# Patient Record
Sex: Female | Born: 1960 | Race: White | Hispanic: No | Smoking: Current every day smoker
Health system: Southern US, Community
[De-identification: ages and names within clinical notes are randomized; demographics above are authoritative.]

## PROBLEM LIST (undated history)

## (undated) DIAGNOSIS — E119 Type 2 diabetes mellitus without complications: Secondary | ICD-10-CM

## (undated) DIAGNOSIS — E785 Hyperlipidemia, unspecified: Secondary | ICD-10-CM

## (undated) DIAGNOSIS — M1990 Unspecified osteoarthritis, unspecified site: Secondary | ICD-10-CM

## (undated) DIAGNOSIS — C801 Malignant (primary) neoplasm, unspecified: Secondary | ICD-10-CM

## (undated) DIAGNOSIS — I10 Essential (primary) hypertension: Secondary | ICD-10-CM

## (undated) DIAGNOSIS — I639 Cerebral infarction, unspecified: Secondary | ICD-10-CM

## (undated) DIAGNOSIS — J45909 Unspecified asthma, uncomplicated: Secondary | ICD-10-CM

## (undated) DIAGNOSIS — R0600 Dyspnea, unspecified: Secondary | ICD-10-CM

## (undated) DIAGNOSIS — D649 Anemia, unspecified: Secondary | ICD-10-CM

## (undated) DIAGNOSIS — F419 Anxiety disorder, unspecified: Secondary | ICD-10-CM

## (undated) DIAGNOSIS — R51 Headache: Secondary | ICD-10-CM

## (undated) DIAGNOSIS — E039 Hypothyroidism, unspecified: Secondary | ICD-10-CM

## (undated) DIAGNOSIS — F329 Major depressive disorder, single episode, unspecified: Secondary | ICD-10-CM

## (undated) DIAGNOSIS — R519 Headache, unspecified: Secondary | ICD-10-CM

## (undated) DIAGNOSIS — F32A Depression, unspecified: Secondary | ICD-10-CM

## (undated) HISTORY — PX: TUBAL LIGATION: SHX77

## (undated) HISTORY — PX: MASTECTOMY: SHX3

## (undated) HISTORY — PX: CHOLECYSTECTOMY: SHX55

---

## 2012-02-05 DIAGNOSIS — C801 Malignant (primary) neoplasm, unspecified: Secondary | ICD-10-CM

## 2012-02-05 HISTORY — DX: Malignant (primary) neoplasm, unspecified: C80.1

## 2012-02-05 HISTORY — PX: BREAST SURGERY: SHX581

## 2015-07-20 ENCOUNTER — Other Ambulatory Visit (HOSPITAL_COMMUNITY): Payer: Self-pay | Admitting: Interventional Radiology

## 2015-07-20 DIAGNOSIS — I729 Aneurysm of unspecified site: Secondary | ICD-10-CM

## 2015-08-02 ENCOUNTER — Ambulatory Visit (HOSPITAL_COMMUNITY): Payer: Self-pay

## 2015-08-03 ENCOUNTER — Ambulatory Visit (HOSPITAL_COMMUNITY)
Admission: RE | Admit: 2015-08-03 | Discharge: 2015-08-03 | Disposition: A | Discharge status: Home / Self Care | Payer: Self-pay | Source: Ambulatory Visit | Attending: Interventional Radiology | Admitting: Interventional Radiology

## 2015-08-03 DIAGNOSIS — I729 Aneurysm of unspecified site: Secondary | ICD-10-CM

## 2015-08-07 ENCOUNTER — Other Ambulatory Visit (HOSPITAL_COMMUNITY): Payer: Self-pay | Admitting: Interventional Radiology

## 2015-08-07 DIAGNOSIS — I729 Aneurysm of unspecified site: Secondary | ICD-10-CM

## 2015-08-14 ENCOUNTER — Other Ambulatory Visit: Payer: Self-pay | Admitting: Radiology

## 2015-08-15 ENCOUNTER — Other Ambulatory Visit (HOSPITAL_COMMUNITY): Payer: Self-pay | Admitting: Interventional Radiology

## 2015-08-15 ENCOUNTER — Encounter (HOSPITAL_COMMUNITY): Payer: Self-pay

## 2015-08-15 ENCOUNTER — Ambulatory Visit (HOSPITAL_COMMUNITY)
Admission: RE | Admit: 2015-08-15 | Discharge: 2015-08-15 | Disposition: A | Discharge status: Home / Self Care | Payer: Self-pay | Source: Ambulatory Visit | Attending: Interventional Radiology | Admitting: Interventional Radiology

## 2015-08-15 DIAGNOSIS — F17210 Nicotine dependence, cigarettes, uncomplicated: Secondary | ICD-10-CM | POA: Insufficient documentation

## 2015-08-15 DIAGNOSIS — I729 Aneurysm of unspecified site: Secondary | ICD-10-CM

## 2015-08-15 DIAGNOSIS — I6523 Occlusion and stenosis of bilateral carotid arteries: Secondary | ICD-10-CM | POA: Insufficient documentation

## 2015-08-15 DIAGNOSIS — C801 Malignant (primary) neoplasm, unspecified: Secondary | ICD-10-CM | POA: Insufficient documentation

## 2015-08-15 DIAGNOSIS — I671 Cerebral aneurysm, nonruptured: Secondary | ICD-10-CM | POA: Insufficient documentation

## 2015-08-15 DIAGNOSIS — F329 Major depressive disorder, single episode, unspecified: Secondary | ICD-10-CM | POA: Insufficient documentation

## 2015-08-15 DIAGNOSIS — J45909 Unspecified asthma, uncomplicated: Secondary | ICD-10-CM | POA: Insufficient documentation

## 2015-08-15 DIAGNOSIS — R51 Headache: Secondary | ICD-10-CM | POA: Insufficient documentation

## 2015-08-15 DIAGNOSIS — Z853 Personal history of malignant neoplasm of breast: Secondary | ICD-10-CM | POA: Insufficient documentation

## 2015-08-15 DIAGNOSIS — F419 Anxiety disorder, unspecified: Secondary | ICD-10-CM | POA: Insufficient documentation

## 2015-08-15 DIAGNOSIS — Z8673 Personal history of transient ischemic attack (TIA), and cerebral infarction without residual deficits: Secondary | ICD-10-CM | POA: Insufficient documentation

## 2015-08-15 HISTORY — DX: Malignant (primary) neoplasm, unspecified: C80.1

## 2015-08-15 HISTORY — DX: Depression, unspecified: F32.A

## 2015-08-15 HISTORY — DX: Major depressive disorder, single episode, unspecified: F32.9

## 2015-08-15 HISTORY — DX: Headache, unspecified: R51.9

## 2015-08-15 HISTORY — DX: Essential (primary) hypertension: I10

## 2015-08-15 HISTORY — DX: Unspecified asthma, uncomplicated: J45.909

## 2015-08-15 HISTORY — DX: Anxiety disorder, unspecified: F41.9

## 2015-08-15 HISTORY — DX: Headache: R51

## 2015-08-15 HISTORY — DX: Cerebral infarction, unspecified: I63.9

## 2015-08-15 LAB — BASIC METABOLIC PANEL
Anion gap: 8 (ref 5–15)
CHLORIDE: 104 mmol/L (ref 101–111)
CO2: 22 mmol/L (ref 22–32)
Calcium: 9.2 mg/dL (ref 8.9–10.3)
Creatinine, Ser: 0.52 mg/dL (ref 0.44–1.00)
GFR calc Af Amer: >60 mL/min (ref >60)
GFR calc non Af Amer: >60 mL/min (ref >60)
Glucose, Bld: 90 mg/dL (ref 65–99)
POTASSIUM: 3.9 mmol/L (ref 3.5–5.1)
SODIUM: 134 mmol/L — AB (ref 135–145)

## 2015-08-15 LAB — PROTIME-INR
INR: 0.97 (ref 0.00–1.49)
Prothrombin Time: 13 seconds (ref 11.6–15.2)

## 2015-08-15 LAB — CBC
HEMATOCRIT: 40.8 % (ref 36.0–46.0)
Hemoglobin: 13.8 g/dL (ref 12.0–15.0)
MCH: 30.8 pg (ref 26.0–34.0)
MCHC: 33.8 g/dL (ref 30.0–36.0)
MCV: 91.1 fL (ref 78.0–100.0)
Platelets: 272 10*3/uL (ref 150–400)
RBC: 4.48 MIL/uL (ref 3.87–5.11)
RDW: 13.5 % (ref 11.5–15.5)
WBC: 6.5 10*3/uL (ref 4.0–10.5)

## 2015-08-15 LAB — APTT: aPTT: 37 seconds (ref 24–37)

## 2015-08-15 MED ORDER — SODIUM CHLORIDE 0.9 % IV SOLN: INTRAVENOUS | Status: AC

## 2015-08-15 MED ORDER — MIDAZOLAM HCL 2 MG/2ML IJ SOLN
INTRAMUSCULAR | Status: AC
  Filled 2015-08-15: qty 2

## 2015-08-15 MED ORDER — IOPAMIDOL (ISOVUE-300) INJECTION 61%
INTRAVENOUS | Status: AC
  Administered 2015-08-15: 75 mL
  Filled 2015-08-15: qty 150

## 2015-08-15 MED ORDER — IOPAMIDOL (ISOVUE-300) INJECTION 61%
INTRAVENOUS | Status: AC
  Administered 2015-08-15: 21 mL
  Filled 2015-08-15: qty 100

## 2015-08-15 MED ORDER — FENTANYL CITRATE (PF) 100 MCG/2ML IJ SOLN
INTRAMUSCULAR | Status: AC
  Filled 2015-08-15: qty 2

## 2015-08-15 MED ORDER — LIDOCAINE HCL 1 % IJ SOLN
INTRAMUSCULAR | Status: AC
  Filled 2015-08-15: qty 20

## 2015-08-15 MED ORDER — LIDOCAINE HCL 1 % IJ SOLN
INTRAMUSCULAR | Status: AC | PRN
  Administered 2015-08-15: 10 mL

## 2015-08-15 MED ORDER — HEPARIN SODIUM (PORCINE) 1000 UNIT/ML IJ SOLN
INTRAMUSCULAR | Status: AC | PRN
  Administered 2015-08-15: 1000 [IU] via INTRAVENOUS

## 2015-08-15 MED ORDER — SODIUM CHLORIDE 0.9 % IV BOLUS (SEPSIS)
250.0000 mL | Freq: Once | INTRAVENOUS | Status: AC
  Administered 2015-08-15: 250 mL via INTRAVENOUS

## 2015-08-15 MED ORDER — FENTANYL CITRATE (PF) 100 MCG/2ML IJ SOLN
INTRAMUSCULAR | Status: AC | PRN
  Administered 2015-08-15: 25 ug via INTRAVENOUS

## 2015-08-15 MED ORDER — HEPARIN SODIUM (PORCINE) 1000 UNIT/ML IJ SOLN
INTRAMUSCULAR | Status: AC
  Filled 2015-08-15: qty 2

## 2015-08-15 MED ORDER — SODIUM CHLORIDE 0.9 % IV SOLN
Freq: Once | INTRAVENOUS | Status: AC
  Administered 2015-08-15: 10:00:00 via INTRAVENOUS

## 2015-08-15 MED ORDER — MIDAZOLAM HCL 2 MG/2ML IJ SOLN
INTRAMUSCULAR | Status: AC | PRN
  Administered 2015-08-15 (×2): 0.5 mg via INTRAVENOUS

## 2015-08-15 NOTE — Procedures (Signed)
S/P 4 vessel cerebral arteriogram. RT CFA approach. Findings. 1.Approx 70 % stenosis RT ICA prox. 2.approx  16mm wide neck prox RT PCOM  aneurysm

## 2015-08-15 NOTE — Progress Notes (Signed)
V-Pad removed after client up and walked and tol well; right groin no bleeding or hematoma

## 2015-08-15 NOTE — Sedation Documentation (Signed)
Patient is resting comfortably. 

## 2015-08-15 NOTE — Sedation Documentation (Signed)
Patient denies pain and is resting comfortably.  

## 2015-08-15 NOTE — Progress Notes (Signed)
Dr Estanislado Pandy notified of b/p and order noted and saline bolus given iv

## 2015-08-15 NOTE — Discharge Instructions (Signed)

## 2015-08-15 NOTE — H&P (Signed)
Chief Complaint: Patient was seen in consultation today for cerebral arteriogram at the request of Dr Celedonio Miyamoto  Referring Physician(s): Dr Celedonio Miyamoto  Supervising Physician: Luanne Bras  Patient Status: Outpatient  History of Present Illness: Kristen Knox is a 55 y.o. 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 Hypertension   . Stroke ([place])   . Asthma   . Depression   . Anxiety   . Headache   . Cancer ([place]) 2014    Rt breast Ca    Past Surgical History  Procedure Laterality Date  . Breast surgery Right 2014    mastectomy  . Mastectomy    . Cholecystectomy    . Tubal ligation      Allergies: Review of patient's allergies indicates no known allergies.  Medications: Prior to Admission medications   Medication Sig Start Date End Date Taking? Authorizing Provider  ALPRAZolam Duanne Moron) 1 MG tablet Take 1 mg by mouth 3 (three) times daily as needed for anxiety.   Yes Historical Provider, MD  atorvastatin (LIPITOR) 40 MG tablet Take 40 mg by mouth daily.   Yes Historical Provider, MD  lisinopril (PRINIVIL,ZESTRIL) 40 MG tablet Take 40 mg by mouth daily.   Yes Historical Provider, MD  sertraline (ZOLOFT) 100 MG tablet Take 100 mg by mouth daily.   Yes Historical Provider, MD     History reviewed. No pertinent family history.  Social History   Social History  . Marital Status: Married    Spouse Name: N/A  . Number of Children: N/A  . Years of Education: N/A   Social History Main Topics  . Smoking status: Current Every Day Smoker  . Smokeless tobacco: None  . Alcohol Use: Yes  . Drug Use: None  . Sexual  Activity: Not Asked   Other Topics Concern  . None   Social History Narrative  . None     Review of Systems: A 12 point ROS discussed and pertinent positives are indicated in the HPI above.  All other systems are negative.  Review of Systems  Constitutional: Negative for fever, diaphoresis, activity change, appetite change and fatigue.  HENT: Negative for hearing loss, tinnitus, trouble swallowing and voice change.   Eyes: Positive for visual disturbance.  Respiratory: Positive for cough. Negative for shortness of breath.   Cardiovascular: Negative for chest pain.  Gastrointestinal: Negative for abdominal pain.  Neurological: Positive for weakness and headaches. Negative for dizziness, tremors, seizures, syncope, facial asymmetry, speech difficulty, light-headedness and numbness.  Psychiatric/Behavioral: Negative for behavioral problems and confusion.    Vital Signs: BP 103/80 mmHg  Pulse 93  Temp(Src) 98 F (36.7 C) (Oral)  Resp 18  Ht 5\' 2"  (1.575 m)  Wt 117 lb (53.071 kg)  BMI 21.39 kg/m2  SpO2 98%  Physical Exam  Constitutional: She is oriented to person, place, and time. She appears well-nourished.  HENT:  Head: Atraumatic.  Eyes: EOM are normal.  Neck: Neck supple.  Cardiovascular: Normal rate, regular rhythm and normal heart sounds.   No murmur heard. Pulmonary/Chest: Effort normal and breath sounds normal. She has no wheezes.  Abdominal: Soft. Bowel sounds are normal.  There is no tenderness.  Musculoskeletal: Normal range of motion.  Neurological: She is alert and oriented to person, place, and time.  Skin: Skin is warm and dry. No erythema.  Psychiatric: She has a normal mood and affect. Her behavior is normal. Judgment and thought content normal.  Nursing note and vitals reviewed.   Mallampati Score:  MD Evaluation Airway: WNL Heart: WNL Abdomen: WNL Chest/ Lungs: WNL ASA  Classification: 3 Mallampati/Airway Score: One  Imaging: Ir Radiologist  Eval & Mgmt  08/10/2015  EXAM: NEW PATIENT OFFICE VISIT CHIEF COMPLAINT: Worsening headaches.  Left-sided visual field blurring. Current Pain Level: 1-10 HISTORY OF PRESENT ILLNESS: Patient is a 55 year old, right-handed lady who has been referred for evaluation of a left posterior communicating artery region aneurysm. The patient is accompanied by her husband. According to her and her husband, there has been progressively worsening headaches in the occipital regions and also bifrontally for the past few months. These headaches apparently last for about an hour and occur on a daily basis without any precipitating factors. There is no associated visual symptoms, nausea, vomiting passing out, or altered mental status associated with these. Independent of these headaches, the patient also has noticed the appearance of moving black spots in her visual fields at times. This has been on a longer basis and most likely represent floaters. There is no associated nausea, vomiting with these headaches. On close questioning, the patient reportedly also has been having memory issues and being forgetful for the past few years. This mostly involves her recent memory though. Following her recent MRI scan she was told that she had had a stroke which was probably responsible for her visual symptoms. Past Medical History: Significant for asthma, history of breast carcinoma with mastectomy on the right-side. Patient reportedly in remission. High blood pressure, high cholesterol, stroke as mentioned above. Previous Surgical History: Tubal ligation, gallbladder removal and a right-sided mastectomy in 2014 as mentioned above. Medications: Lisinopril. Zoloft for depression. Xanax for anxiety. Lipitor. Allergies: No known allergies reported. Social History: Patient is married has two sons alive and well. Drinks alcohol twice a week, beer. Denies use of illicit chemicals. Smokes a pack per day has done so for the last 45 years. Family  History: Dad with abdominal aortic aneurysm. No history of intracranial aneurysms or strokes. Father died of Alzheimer's. Mother is alive and well. REVIEW OF SYSTEMS: Denies recent chest pain, shortness of breath. Has had intermittent palpitations. Does have chronic smoker's cough with occasional sputum. Denies hemoptyses or wheezing. No abdominal pains or nausea and vomiting as mentioned above. No constipation or diarrhea. Denies any blood in her stools. Denies any dysuria, hematuria or polyuria. Denies any recent chills or fever or rigors. Her weight is steady.  Appetite unchanged. PHYSICAL EXAMINATION: In no acute distress.  Affect normal. Neurologically intact except for apparently left homonymous hemianopsia in the left lower quadrant. ASSESSMENT AND PLAN: The patient's recent MRI and MRA of the brain was reviewed with her and her husband. 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 This measures approximately 4.7 mm. Also noted was a right occipital cortical-based ischemic infarct with encephalomalacia. Additionally nonspecific subcortical white matter hyperintensities are noted on T2 and FLAIR sequences. These were however felt related to chronic microvascular ischemic changes given the patient's history. A demyelinating process or vasculitis is felt to be less likely. Given the patient's symptoms, it was felt the patient would require a diagnostic  catheter arteriogram to evaluate for extracranial and intracranial vasculature. The procedure, the risks, the benefits were all reviewed in detail. Alternatives were reviewed. Further recommendations depending on the findings on the diagnostic catheter arteriogram. The patient and her husband want to proceed with the diagnostic catheter arteriogram will be scheduled at the earliest possible. They were asked to call should they have any concerns or questions. Electronically Signed    By: Luanne Bras M.D.   On: 08/03/2015 15:57    Labs:  CBC:  Recent Labs  08/15/15 0930  WBC 6.5  HGB 13.8  HCT 40.8  PLT 272    COAGS:  Recent Labs  08/15/15 0930  INR 0.97  APTT 37    BMP:  Recent Labs  08/15/15 0930  NA 134*  K 3.9  CL 104  CO2 22  GLUCOSE 90  BUN <5*  CALCIUM 9.2  CREATININE 0.52  GFRNONAA >60  GFRAA >60    LIVER FUNCTION TESTS: No results for input(s): BILITOT, AST, ALT, ALKPHOS, PROT, ALBUMIN in the last 8760 hours.  TUMOR MARKERS: No results for input(s): AFPTM, CEA, CA199, CHROMGRNA in the last 8760 hours.  Assessment and Plan:  Headaches and left visual changes x 2 months Seen by PMD and referred to Dr Estanislado Pandy with findings of CVA and L PCOM aneurysm Now scheduled for cerebral arteriogram Risks and Benefits discussed with the patient including, but not limited to bleeding, infection, vascular injury, contrast induced renal failure, stroke or even death. All of the patient's questions were answered, patient is agreeable to proceed. Consent signed and in chart.  Thank you for this interesting consult.  I greatly enjoyed meeting [facility] and look forward to participating in their care.  A copy of this report was sent to the requesting provider on this date.  Electronically Signed: Danayah Smyre A 08/15/2015, 11:10 AM   I spent a total of  30 Minutes   in face to face in clinical consultation, greater than 50% of which was counseling/coordinating care for cerebral arteriogram

## 2015-08-23 ENCOUNTER — Other Ambulatory Visit (HOSPITAL_COMMUNITY): Payer: Self-pay | Admitting: Interventional Radiology

## 2015-08-23 DIAGNOSIS — I671 Cerebral aneurysm, nonruptured: Secondary | ICD-10-CM

## 2015-08-25 ENCOUNTER — Ambulatory Visit (HOSPITAL_COMMUNITY): Payer: Self-pay

## 2015-09-05 ENCOUNTER — Ambulatory Visit (HOSPITAL_COMMUNITY)
Admission: RE | Admit: 2015-09-05 | Discharge: 2015-09-05 | Disposition: A | Discharge status: Home / Self Care | Payer: Self-pay | Source: Ambulatory Visit | Attending: Interventional Radiology | Admitting: Interventional Radiology

## 2015-09-05 DIAGNOSIS — I671 Cerebral aneurysm, nonruptured: Secondary | ICD-10-CM

## 2015-09-05 HISTORY — PX: IR GENERIC HISTORICAL: IMG1180011

## 2015-09-08 ENCOUNTER — Encounter (HOSPITAL_COMMUNITY): Payer: Self-pay | Admitting: Interventional Radiology

## 2015-11-20 ENCOUNTER — Telehealth (HOSPITAL_COMMUNITY): Payer: Self-pay | Admitting: Radiology

## 2015-11-20 NOTE — Telephone Encounter (Signed)
On Thursday, 11/16/15, pt's husband made multiple calls to our office. He was very agitated and did not sound "normal." Sounded as though he may have been drinking. He was insistent that I do something about his wife. He states that she is having "all the symptoms she could possibly have" to do with her brain condition. He also stated that she is drinking heavily, calling the police on him constantly, stealing everything he has, as well as having blindness at times for an hour or more at a time. I tried to reason with the spouse and encouraged him to call EMS if there were symptoms that needed to be checked. We discussed this at great length. He called the office multiple times that day and Gildardo Pounds and myself spoke to both the husband and the pt. The pt stated the she is fine and it is him who is the problem. She did admit to having 1 episode where she went blind in 1 eye for about an hour. Caryl Pina and I both encouraged the pt and the husband to call 911 for medical assistant or to come directly to the ED. As of today, 11/20/15, I see no record of her making the trip to the ED. I informed the spouse that we could not force his wife to make the trip to the ED and again encouraged him to proceed with calling 911. He said, thanks for nothing, and hung up on me. JM

## 2016-02-05 DIAGNOSIS — I639 Cerebral infarction, unspecified: Secondary | ICD-10-CM

## 2016-02-05 HISTORY — DX: Cerebral infarction, unspecified: I63.9

## 2016-04-24 DIAGNOSIS — F17219 Nicotine dependence, cigarettes, with unspecified nicotine-induced disorders: Secondary | ICD-10-CM | POA: Diagnosis not present

## 2016-04-24 DIAGNOSIS — Z171 Estrogen receptor negative status [ER-]: Secondary | ICD-10-CM | POA: Diagnosis not present

## 2016-04-24 DIAGNOSIS — R634 Abnormal weight loss: Secondary | ICD-10-CM | POA: Diagnosis not present

## 2016-04-24 DIAGNOSIS — R1904 Left lower quadrant abdominal swelling, mass and lump: Secondary | ICD-10-CM | POA: Diagnosis not present

## 2016-04-24 DIAGNOSIS — Z853 Personal history of malignant neoplasm of breast: Secondary | ICD-10-CM | POA: Diagnosis not present

## 2016-06-14 ENCOUNTER — Telehealth (HOSPITAL_COMMUNITY): Payer: Self-pay

## 2016-06-14 NOTE — Telephone Encounter (Signed)
Called to schedule, left vm. AW

## 2016-07-02 ENCOUNTER — Other Ambulatory Visit (HOSPITAL_COMMUNITY): Payer: Self-pay | Admitting: Interventional Radiology

## 2016-07-02 DIAGNOSIS — I771 Stricture of artery: Secondary | ICD-10-CM

## 2016-07-09 ENCOUNTER — Ambulatory Visit (HOSPITAL_COMMUNITY)
Admission: RE | Admit: 2016-07-09 | Discharge: 2016-07-09 | Disposition: A | Discharge status: Home / Self Care | Payer: Medicaid Other | Source: Ambulatory Visit | Attending: Interventional Radiology | Admitting: Interventional Radiology

## 2016-07-09 DIAGNOSIS — I771 Stricture of artery: Secondary | ICD-10-CM

## 2016-07-09 LAB — VAS US CAROTID
LEFT ECA DIAS: -33 cm/s
LEFT VERTEBRAL DIAS: 15 cm/s
LICAPDIAS: -31 cm/s
Left CCA dist dias: -32 cm/s
Left CCA dist sys: -85 cm/s
Left CCA prox dias: 27 cm/s
Left CCA prox sys: 96 cm/s
Left ICA dist dias: -33 cm/s
Left ICA dist sys: -81 cm/s
Left ICA prox sys: -106 cm/s
RCCADSYS: -135 cm/s
RIGHT ECA DIAS: -76 cm/s
RIGHT VERTEBRAL DIAS: 27 cm/s
Right CCA prox dias: 21 cm/s
Right CCA prox sys: 63 cm/s

## 2016-07-09 NOTE — Progress Notes (Signed)
*  PRELIMINARY RESULTS* Vascular Ultrasound Carotid Duplex (Doppler) has been completed.  Preliminary findings: Right 60-79% ICA stenosis, high end of range. Left 1-39% ICA stenosis. Antegrade vertebral flow.   Landry Mellow, RDMS, RVT  07/09/2016, 2:30 PM

## 2016-08-01 ENCOUNTER — Telehealth (HOSPITAL_COMMUNITY): Payer: Self-pay

## 2016-08-01 NOTE — Telephone Encounter (Signed)
Left message for pt to return call. AW 

## 2016-09-03 ENCOUNTER — Telehealth (HOSPITAL_COMMUNITY): Payer: Self-pay

## 2016-09-03 NOTE — Telephone Encounter (Signed)
Called to schedule angio, left message for pt to return call. AW 

## 2016-09-09 ENCOUNTER — Other Ambulatory Visit (HOSPITAL_COMMUNITY): Payer: Self-pay | Admitting: Interventional Radiology

## 2016-09-09 DIAGNOSIS — I771 Stricture of artery: Secondary | ICD-10-CM

## 2016-09-09 DIAGNOSIS — I729 Aneurysm of unspecified site: Secondary | ICD-10-CM

## 2016-09-11 ENCOUNTER — Other Ambulatory Visit: Payer: Self-pay | Admitting: Radiology

## 2016-09-11 ENCOUNTER — Other Ambulatory Visit: Payer: Self-pay | Admitting: General Surgery

## 2016-09-12 ENCOUNTER — Other Ambulatory Visit (HOSPITAL_COMMUNITY): Payer: Self-pay | Admitting: Interventional Radiology

## 2016-09-12 ENCOUNTER — Ambulatory Visit (HOSPITAL_COMMUNITY)
Admission: RE | Admit: 2016-09-12 | Discharge: 2016-09-12 | Disposition: A | Discharge status: Home / Self Care | Payer: Medicaid Other | Source: Ambulatory Visit | Attending: Interventional Radiology | Admitting: Interventional Radiology

## 2016-09-12 DIAGNOSIS — Z8673 Personal history of transient ischemic attack (TIA), and cerebral infarction without residual deficits: Secondary | ICD-10-CM | POA: Insufficient documentation

## 2016-09-12 DIAGNOSIS — R51 Headache: Secondary | ICD-10-CM | POA: Diagnosis present

## 2016-09-12 DIAGNOSIS — J45909 Unspecified asthma, uncomplicated: Secondary | ICD-10-CM | POA: Insufficient documentation

## 2016-09-12 DIAGNOSIS — I771 Stricture of artery: Secondary | ICD-10-CM

## 2016-09-12 DIAGNOSIS — I671 Cerebral aneurysm, nonruptured: Secondary | ICD-10-CM | POA: Insufficient documentation

## 2016-09-12 DIAGNOSIS — F419 Anxiety disorder, unspecified: Secondary | ICD-10-CM | POA: Diagnosis not present

## 2016-09-12 DIAGNOSIS — I729 Aneurysm of unspecified site: Secondary | ICD-10-CM

## 2016-09-12 DIAGNOSIS — Z7982 Long term (current) use of aspirin: Secondary | ICD-10-CM | POA: Insufficient documentation

## 2016-09-12 DIAGNOSIS — Z853 Personal history of malignant neoplasm of breast: Secondary | ICD-10-CM | POA: Diagnosis not present

## 2016-09-12 DIAGNOSIS — I10 Essential (primary) hypertension: Secondary | ICD-10-CM | POA: Diagnosis not present

## 2016-09-12 DIAGNOSIS — F17210 Nicotine dependence, cigarettes, uncomplicated: Secondary | ICD-10-CM | POA: Diagnosis not present

## 2016-09-12 DIAGNOSIS — I6521 Occlusion and stenosis of right carotid artery: Secondary | ICD-10-CM | POA: Insufficient documentation

## 2016-09-12 DIAGNOSIS — F329 Major depressive disorder, single episode, unspecified: Secondary | ICD-10-CM | POA: Diagnosis not present

## 2016-09-12 HISTORY — PX: IR 3D INDEPENDENT WKST: IMG2385

## 2016-09-12 HISTORY — PX: IR ANGIO VERTEBRAL SEL VERTEBRAL UNI R MOD SED: IMG5368

## 2016-09-12 HISTORY — PX: IR ANGIO INTRA EXTRACRAN SEL COM CAROTID INNOMINATE BILAT MOD SED: IMG5360

## 2016-09-12 HISTORY — PX: IR ANGIO VERTEBRAL SEL SUBCLAVIAN INNOMINATE UNI L MOD SED: IMG5364

## 2016-09-12 LAB — BASIC METABOLIC PANEL
ANION GAP: 10 (ref 5–15)
BUN: 5 mg/dL — ABNORMAL LOW (ref 6–20)
CALCIUM: 9 mg/dL (ref 8.9–10.3)
CO2: 21 mmol/L — ABNORMAL LOW (ref 22–32)
Chloride: 102 mmol/L (ref 101–111)
Creatinine, Ser: 0.5 mg/dL (ref 0.44–1.00)
GLUCOSE: 89 mg/dL (ref 65–99)
Potassium: 4 mmol/L (ref 3.5–5.1)
SODIUM: 133 mmol/L — AB (ref 135–145)

## 2016-09-12 LAB — CBC
HEMATOCRIT: 37.5 % (ref 36.0–46.0)
HEMOGLOBIN: 13 g/dL (ref 12.0–15.0)
MCH: 31.3 pg (ref 26.0–34.0)
MCHC: 34.7 g/dL (ref 30.0–36.0)
MCV: 90.1 fL (ref 78.0–100.0)
Platelets: 281 10*3/uL (ref 150–400)
RBC: 4.16 MIL/uL (ref 3.87–5.11)
RDW: 14.1 % (ref 11.5–15.5)
WBC: 5.8 10*3/uL (ref 4.0–10.5)

## 2016-09-12 LAB — APTT: APTT: 36 s (ref 24–36)

## 2016-09-12 LAB — PROTIME-INR
INR: 0.89
PROTHROMBIN TIME: 12 s (ref 11.4–15.2)

## 2016-09-12 MED ORDER — SODIUM CHLORIDE 0.9 % IV SOLN
Freq: Once | INTRAVENOUS | Status: AC
  Administered 2016-09-12: 09:00:00 via INTRAVENOUS

## 2016-09-12 MED ORDER — LIDOCAINE HCL (PF) 1 % IJ SOLN
INTRAMUSCULAR | Status: AC | PRN
  Administered 2016-09-12: 15 mL

## 2016-09-12 MED ORDER — MIDAZOLAM HCL 2 MG/2ML IJ SOLN
INTRAMUSCULAR | Status: AC
Start: 2016-09-12 — End: 2016-09-12
  Filled 2016-09-12: qty 2

## 2016-09-12 MED ORDER — IOPAMIDOL (ISOVUE-300) INJECTION 61%
INTRAVENOUS | Status: AC
  Filled 2016-09-12: qty 50

## 2016-09-12 MED ORDER — IOPAMIDOL (ISOVUE-300) INJECTION 61%
INTRAVENOUS | Status: AC
  Administered 2016-09-12: 21 mL
  Filled 2016-09-12: qty 100

## 2016-09-12 MED ORDER — HEPARIN SODIUM (PORCINE) 1000 UNIT/ML IJ SOLN
INTRAMUSCULAR | Status: AC | PRN
  Administered 2016-09-12: 1500 [IU] via INTRAVENOUS

## 2016-09-12 MED ORDER — HEPARIN SODIUM (PORCINE) 1000 UNIT/ML IJ SOLN
INTRAMUSCULAR | Status: AC
Start: 2016-09-12 — End: 2016-09-12
  Filled 2016-09-12: qty 2

## 2016-09-12 MED ORDER — FENTANYL CITRATE (PF) 100 MCG/2ML IJ SOLN
INTRAMUSCULAR | Status: AC
  Filled 2016-09-12: qty 2

## 2016-09-12 MED ORDER — SODIUM CHLORIDE 0.9 % IV SOLN: INTRAVENOUS | Status: AC

## 2016-09-12 MED ORDER — FENTANYL CITRATE (PF) 100 MCG/2ML IJ SOLN
INTRAMUSCULAR | Status: AC | PRN
  Administered 2016-09-12: 25 ug via INTRAVENOUS

## 2016-09-12 MED ORDER — LIDOCAINE HCL (PF) 1 % IJ SOLN
INTRAMUSCULAR | Status: AC
  Filled 2016-09-12: qty 30

## 2016-09-12 MED ORDER — MIDAZOLAM HCL 2 MG/2ML IJ SOLN
INTRAMUSCULAR | Status: AC | PRN
  Administered 2016-09-12: 1 mg via INTRAVENOUS

## 2016-09-12 MED ORDER — IOPAMIDOL (ISOVUE-300) INJECTION 61%
INTRAVENOUS | Status: AC
Start: 2016-09-12 — End: 2016-09-12
  Administered 2016-09-12: 65 mL
  Filled 2016-09-12: qty 150

## 2016-09-12 NOTE — Sedation Documentation (Signed)
Right groin sheath removed. V-Pad applied 

## 2016-09-12 NOTE — H&P (Signed)
Chief Complaint: Patient was seen in consultation today for right internal carotid artery stenosis.  Supervising Physician: Luanne Bras  Patient Status: [facility] - Out-pt  History of Present Illness: Kristen Knox is a 56 y.o. female with past medical history of anxiety, asthma, right breast cancer, depression, HTN, and CVA. Patient known to [facility] service from prior angiogram in 2017.   Angiogram 08/21/16 showed: Approximately 70% narrowing of the right internal carotid artery at the bulb by the NASCET criteria. Approximately 2 x 2 mm, wide-based, saccular aneurysm arising from the proximal left posterior communicating artery. The angiographic findings were reviewed with the patient and spouse. They will return in consultation to discuss the management of the above findings. In the meantime, the patient has been advised to start taking one baby aspirin a day.  Patient was planning for follow-up for discussion re: management, however patient's husband became ill and she was unable to proceed. For this reason, a repeat diagnostic angiogram is warranted.   Patient presents for procedure today in her usual state of health. She does say she has recently been diagnosed with a "blood protein disorder" but doesn't know which one.  She has been NPO other than a few sips of coffee with medication this AM. She does not take blood thinners and has continued her aspirin.   Past Medical History:  Diagnosis Date  . Anxiety   . Asthma   . Cancer ([place]) 2014   Rt breast Ca  . Depression   . Headache   . Hypertension   . Stroke [facility])     Past Surgical History:  Procedure Laterality Date  . BREAST SURGERY Right 2014   mastectomy  . CHOLECYSTECTOMY    . IR GENERIC HISTORICAL  09/05/2015   IR RADIOLOGIST EVAL & MGMT 09/05/2015 [facility]-INTERV RAD  . MASTECTOMY    . TUBAL LIGATION      Allergies: Patient has no known allergies.  Medications: Prior to Admission medications   Medication Sig Start  Date End Date Taking? Authorizing Provider  ALPRAZolam Duanne Moron) 1 MG tablet Take 1 mg by mouth 3 (three) times daily as needed for anxiety.   Yes [provider]  aspirin 81 MG chewable tablet Chew 81 mg by mouth daily.   Yes [provider]  atorvastatin (LIPITOR) 40 MG tablet Take 40 mg by mouth daily.   Yes [provider]  FLUoxetine (PROZAC) 40 MG capsule Take 40 mg by mouth daily.   Yes [provider]  lisinopril (PRINIVIL,ZESTRIL) 40 MG tablet Take 40 mg by mouth daily.   Yes [provider]     No family history on file.  Social History   Social History  . Marital status: Widowed    Spouse name: N/A  . Number of children: N/A  . Years of education: N/A   Social History Main Topics  . Smoking status: Current Every Day Smoker  . Smokeless tobacco: Not on file  . Alcohol use Yes  . Drug use: Unknown  . Sexual activity: Not on file   Other Topics Concern  . Not on file   Social History Narrative  . No narrative on file    Review of Systems  Constitutional: Negative for fatigue and fever.  Eyes: Negative for visual disturbance.  Respiratory: Negative for cough and shortness of breath.   Cardiovascular: Negative for chest pain.  Gastrointestinal: Negative for abdominal pain.  Neurological: Negative for headaches.  Psychiatric/Behavioral: Negative for behavioral problems and confusion.  Vital Signs: BP 117/84   Pulse 90   Temp 98.4 F (36.9 C) (Oral)   Ht 5\' 2"  (1.575 m)   Wt 105 lb (47.6 kg)   SpO2 99%   BMI 19.20 kg/m   Physical Exam  Constitutional: She is oriented to person, place, and time. She appears well-developed.  Cardiovascular: Normal rate, regular rhythm and normal heart sounds.   Pulmonary/Chest: Effort normal and breath sounds normal. No respiratory distress.  Neurological: She is alert and oriented to person, place, and time.  Skin: Skin is warm and dry.  Psychiatric: She has a normal mood and  affect. Her behavior is normal. Judgment and thought content normal.  Nursing note and vitals reviewed.   Mallampati Score:  MD Evaluation Airway: WNL Heart: WNL Abdomen: WNL Chest/ Lungs: WNL ASA  Classification: 3 Mallampati/Airway Score: Two  Imaging: No results found.  Labs:  CBC:  Recent Labs  09/12/16 0830  WBC 5.8  HGB 13.0  HCT 37.5  PLT 281    COAGS:  Recent Labs  09/12/16 0830  INR 0.89  APTT 36    BMP:  Recent Labs  09/12/16 0830  NA 133*  K 4.0  CL 102  CO2 21*  GLUCOSE 89  BUN 5*  CALCIUM 9.0  CREATININE 0.50  GFRNONAA >60  GFRAA >60    LIVER FUNCTION TESTS: No results for input(s): BILITOT, AST, ALT, ALKPHOS, PROT, ALBUMIN in the last 8760 hours.  TUMOR MARKERS: No results for input(s): AFPTM, CEA, CA199, CHROMGRNA in the last 8760 hours.  Assessment and Plan: Patient with past medical history of right internal artery stenosis presents for ongoing evaluation and management.  Patient presents for diagnostic cerebral angiogram today.  Patient presents today in their usual state of health.  She has been NPO and is not currently on blood thinners.   Risks and benefits discussed with the patient including, but not limited to bleeding, infection, vascular injury or contrast induced renal failure. All of the patient's questions were answered, patient is agreeable to proceed. Consent signed and in chart.  Thank you for this interesting consult.  I greatly enjoyed meeting [facility] and look forward to participating in their care.  A copy of this report was sent to the requesting provider on this date.  Electronically Signed: Docia Barrier, PA 09/12/2016, 10:00 AM   I spent a total of    15 Minutes in face to face in clinical consultation, greater than 50% of which was counseling/coordinating care for right internal carotid artery stenosis.

## 2016-09-12 NOTE — Discharge Instructions (Signed)
Angiogram, Care After °This sheet gives you information about how to care for yourself after your procedure. Your doctor may also give you more specific instructions. If you have problems or questions, contact your doctor. °Follow these instructions at home: °Insertion site care °· Follow instructions from your doctor about how to take care of your long, thin tube (catheter) insertion area. Make sure you: °? Wash your hands with soap and water before you change your bandage (dressing). If you cannot use soap and water, use hand sanitizer. °? Change your bandage as told by your doctor. °? Leave stitches (sutures), skin glue, or skin tape (adhesive) strips in place. They may need to stay in place for 2 weeks or longer. If tape strips get loose and curl up, you may trim the loose edges. Do not remove tape strips completely unless your doctor says it is okay. °· Do not take baths, swim, or use a hot tub until your doctor says it is okay. °· You may shower 24-48 hours after the procedure or as told by your doctor. °? Gently wash the area with plain soap and water. °? Pat the area dry with a clean towel. °? Do not rub the area. This may cause bleeding. °· Do not apply powder or lotion to the area. Keep the area clean and dry. °· Check your insertion area every day for signs of infection. Check for: °? More redness, swelling, or pain. °? Fluid or blood. °? Warmth. °? Pus or a bad smell. °Activity °· Rest as told by your doctor, usually for 1-2 days. °· Do not lift anything that is heavier than 10 lbs. (4.5 kg) or as told by your doctor. °· Do not drive for 24 hours if you were given a medicine to help you relax (sedative). °· Do not drive or use heavy machinery while taking prescription pain medicine. °General instructions °· Go back to your normal activities as told by your doctor, usually in about a week. Ask your doctor what activities are safe for you. °· If the insertion area starts to bleed, lie flat and put pressure  on the area. If the bleeding does not stop, get help right away. This is an emergency. °· Drink enough fluid to keep your pee (urine) clear or pale yellow. °· Take over-the-counter and prescription medicines only as told by your doctor. °· Keep all follow-up visits as told by your doctor. This is important. °Contact a doctor if: °· You have a fever. °· You have chills. °· You have more redness, swelling, or pain around your insertion area. °· You have fluid or blood coming from your insertion area. °· The insertion area feels warm to the touch. °· You have pus or a bad smell coming from your insertion area. °· You have more bruising around the insertion area. °· Blood collects in the tissue around the insertion area (hematoma) that may be painful to the touch. °Get help right away if: °· You have a lot of pain in the insertion area. °· The insertion area swells very fast. °· The insertion area is bleeding, and the bleeding does not stop after holding steady pressure on the area. °· The area near or just beyond the insertion area becomes pale, cool, tingly, or numb. °These symptoms may be an emergency. Do not wait to see if the symptoms will go away. Get medical help right away. Call your local emergency services (911 in the [place] Do not drive yourself to the hospital. °Summary °·   After the procedure, it is common to have bruising and tenderness at the long, thin tube insertion area.  After the procedure, it is important to rest and drink plenty of fluids.  Do not take baths, swim, or use a hot tub until your doctor says it is okay to do so. You may shower 24-48 hours after the procedure or as told by your doctor.  If the insertion area starts to bleed, lie flat and put pressure on the area. If the bleeding does not stop, get help right away. This is an emergency. This information is not intended to replace advice given to you by your health care provider. Make sure you discuss any questions you have with  your health care provider. Document Released: 04/19/2008 Document Revised: 01/16/2016 Document Reviewed: 01/16/2016 Elsevier Interactive Patient Education  2017 Else[employer]. Moderate Conscious Sedation, Adult Sedation is the use of medicines to promote relaxation and relieve discomfort and anxiety. Moderate conscious sedation is a type of sedation. Under moderate conscious sedation, you are less alert than normal, but you are still able to respond to instructions, touch, or both. Moderate conscious sedation is used during short medical and dental procedures. It is milder than deep sedation, which is a type of sedation under which you cannot be easily woken up. It is also milder than general anesthesia, which is the use of medicines to make you unconscious. Moderate conscious sedation allows you to return to your regular activities sooner. Tell a health care provider about:  Any allergies you have.  All medicines you are taking, including vitamins, herbs, eye drops, creams, and over-the-counter medicines.  Use of steroids (by mouth or creams).  Any problems you or family members have had with sedatives and anesthetic medicines.  Any blood disorders you have.  Any surgeries you have had.  Any medical conditions you have, such as sleep apnea.  Whether you are pregnant or may be pregnant.  Any use of cigarettes, alcohol, marijuana, or street drugs. What are the risks? Generally, this is a safe procedure. However, problems may occur, including:  Getting too much medicine (oversedation).  Nausea.  Allergic reaction to medicines.  Trouble breathing. If this happens, a breathing tube may be used to help with breathing. It will be removed when you are awake and breathing on your own.  Heart trouble.  Lung trouble.  What happens before the procedure? Staying hydrated Follow instructions from your health care provider about hydration, which may include:  Up to 2 hours before the  procedure - you may continue to drink clear liquids, such as water, clear fruit juice, black coffee, and plain tea.  Eating and drinking restrictions Follow instructions from your health care provider about eating and drinking, which may include:  8 hours before the procedure - stop eating heavy meals or foods such as meat, fried foods, or fatty foods.  6 hours before the procedure - stop eating light meals or foods, such as toast or cereal.  6 hours before the procedure - stop drinking milk or drinks that contain milk.  2 hours before the procedure - stop drinking clear liquids.  Medicine  Ask your health care provider about:  Changing or stopping your regular medicines. This is especially important if you are taking diabetes medicines or blood thinners.  Taking medicines such as aspirin and ibuprofen. These medicines can thin your blood. Do not take these medicines before your procedure if your health care provider instructs you not to.  Tests and exams  You will have a physical exam.  You may have blood tests done to show: ? How well your kidneys and liver are working. ? How well your blood can clot. General instructions  Plan to have someone take you home from the hospital or clinic.  If you will be going home right after the procedure, plan to have someone with you for 24 hours. What happens during the procedure?  An IV tube will be inserted into one of your veins.  Medicine to help you relax (sedative) will be given through the IV tube.  The medical or dental procedure will be performed. What happens after the procedure?  Your blood pressure, heart rate, breathing rate, and blood oxygen level will be monitored often until the medicines you were given have worn off.  Do not drive for 24 hours. This information is not intended to replace advice given to you by your health care provider. Make sure you discuss any questions you have with your health care  provider. Document Released: 10/16/2000 Document Revised: 06/27/2015 Document Reviewed: 05/13/2015 Elsevier Interactive Patient Education  [employer].

## 2016-09-12 NOTE — Procedures (Signed)
S/P 4 vessel cerebral arteriogram RT CFA approach. Findings. 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

## 2016-09-12 NOTE — Sedation Documentation (Signed)
Called to give report. Nurse unavailable. Will call back 

## 2016-09-12 NOTE — Sedation Documentation (Signed)
ETC02 removed per Dr. Deveshwar  

## 2016-09-13 ENCOUNTER — Encounter (HOSPITAL_COMMUNITY): Payer: Self-pay | Admitting: Interventional Radiology

## 2016-09-16 ENCOUNTER — Telehealth (HOSPITAL_COMMUNITY): Payer: Self-pay

## 2016-09-16 ENCOUNTER — Other Ambulatory Visit (HOSPITAL_COMMUNITY): Payer: Self-pay | Admitting: Interventional Radiology

## 2016-09-16 DIAGNOSIS — I729 Aneurysm of unspecified site: Secondary | ICD-10-CM

## 2016-09-16 NOTE — Telephone Encounter (Signed)
Called to schedule 2 wk f/u, left message for pt to return call. AW 

## 2016-09-30 ENCOUNTER — Ambulatory Visit (HOSPITAL_COMMUNITY): Payer: Medicaid Other

## 2016-10-11 ENCOUNTER — Telehealth (HOSPITAL_COMMUNITY): Payer: Self-pay

## 2016-10-11 NOTE — Telephone Encounter (Signed)
Called to schedule f/u, left message for pt to return call. AW 

## 2016-10-23 ENCOUNTER — Ambulatory Visit (HOSPITAL_COMMUNITY)
Admission: RE | Admit: 2016-10-23 | Discharge: 2016-10-23 | Disposition: A | Discharge status: Home / Self Care | Payer: Medicaid Other | Source: Ambulatory Visit | Attending: Interventional Radiology | Admitting: Interventional Radiology

## 2016-10-23 DIAGNOSIS — I729 Aneurysm of unspecified site: Secondary | ICD-10-CM

## 2016-10-23 HISTORY — PX: IR RADIOLOGIST EVAL & MGMT: IMG5224

## 2016-10-24 ENCOUNTER — Encounter (HOSPITAL_COMMUNITY): Payer: Self-pay | Admitting: Interventional Radiology

## 2017-03-18 ENCOUNTER — Other Ambulatory Visit (HOSPITAL_COMMUNITY): Payer: Self-pay | Admitting: Interventional Radiology

## 2017-03-18 DIAGNOSIS — I771 Stricture of artery: Secondary | ICD-10-CM

## 2017-03-18 DIAGNOSIS — I729 Aneurysm of unspecified site: Secondary | ICD-10-CM

## 2017-03-27 ENCOUNTER — Ambulatory Visit (HOSPITAL_COMMUNITY): Payer: Medicaid Other

## 2017-04-02 ENCOUNTER — Ambulatory Visit (HOSPITAL_COMMUNITY)
Admission: RE | Admit: 2017-04-02 | Discharge: 2017-04-02 | Disposition: A | Discharge status: Home / Self Care | Payer: Medicaid Other | Source: Ambulatory Visit | Attending: Interventional Radiology | Admitting: Interventional Radiology

## 2017-04-02 DIAGNOSIS — I6523 Occlusion and stenosis of bilateral carotid arteries: Secondary | ICD-10-CM | POA: Insufficient documentation

## 2017-04-02 DIAGNOSIS — I771 Stricture of artery: Secondary | ICD-10-CM

## 2017-04-02 DIAGNOSIS — I729 Aneurysm of unspecified site: Secondary | ICD-10-CM | POA: Diagnosis not present

## 2017-04-03 NOTE — Progress Notes (Signed)
Bilateral carotid duplex completed. Right -60% to 79% ICA stenosis. Right - 1% to 39% ICA stenosis. Bilateral vertebral artery flow is antegrade. [facility], [place] 04/02/2017

## 2017-04-24 ENCOUNTER — Telehealth (HOSPITAL_COMMUNITY): Payer: Self-pay

## 2017-04-24 NOTE — Telephone Encounter (Signed)
Pt agreed to f/u in 6 months with us carotid. AW 

## 2017-09-18 ENCOUNTER — Other Ambulatory Visit (HOSPITAL_COMMUNITY): Payer: Self-pay | Admitting: Interventional Radiology

## 2017-09-18 ENCOUNTER — Telehealth (HOSPITAL_COMMUNITY): Payer: Self-pay

## 2017-09-18 DIAGNOSIS — I771 Stricture of artery: Secondary | ICD-10-CM

## 2017-09-18 DIAGNOSIS — I729 Aneurysm of unspecified site: Secondary | ICD-10-CM

## 2017-09-18 NOTE — Telephone Encounter (Signed)
Called to schedule f/u US carotid and mra, no answer, left vm. AW

## 2017-09-22 DIAGNOSIS — Z9011 Acquired absence of right breast and nipple: Secondary | ICD-10-CM | POA: Diagnosis not present

## 2017-09-22 DIAGNOSIS — Z853 Personal history of malignant neoplasm of breast: Secondary | ICD-10-CM

## 2017-09-23 ENCOUNTER — Ambulatory Visit (HOSPITAL_COMMUNITY): Admission: RE | Admit: 2017-09-23 | Payer: Medicaid Other | Source: Ambulatory Visit

## 2017-10-03 ENCOUNTER — Ambulatory Visit (HOSPITAL_COMMUNITY)
Admission: RE | Admit: 2017-10-03 | Discharge: 2017-10-03 | Disposition: A | Discharge status: Home / Self Care | Payer: Medicaid Other | Source: Ambulatory Visit | Attending: Interventional Radiology | Admitting: Interventional Radiology

## 2017-10-03 DIAGNOSIS — I671 Cerebral aneurysm, nonruptured: Secondary | ICD-10-CM | POA: Insufficient documentation

## 2017-10-03 DIAGNOSIS — I729 Aneurysm of unspecified site: Secondary | ICD-10-CM

## 2017-10-03 DIAGNOSIS — I771 Stricture of artery: Secondary | ICD-10-CM | POA: Insufficient documentation

## 2017-10-03 NOTE — Progress Notes (Signed)
Carotid artery duplex has been completed. 60-79% ICA stenosis on the right. 40-59% ICA stenosis on the left.  10/03/17 11:43 AM Kristen Knox RVT

## 2017-10-07 ENCOUNTER — Telehealth (HOSPITAL_COMMUNITY): Payer: Self-pay

## 2017-10-07 NOTE — Telephone Encounter (Signed)
Pt agreed to f/u in 6 months with US carotid and mra w/o. AW

## 2018-02-04 DIAGNOSIS — E041 Nontoxic single thyroid nodule: Secondary | ICD-10-CM

## 2018-02-04 HISTORY — DX: Nontoxic single thyroid nodule: E04.1

## 2018-03-19 ENCOUNTER — Telehealth (HOSPITAL_COMMUNITY): Payer: Self-pay

## 2018-03-19 NOTE — Telephone Encounter (Signed)
Called to schedule f/u us carotid, no answer, left vm. AW 

## 2018-04-07 ENCOUNTER — Other Ambulatory Visit (HOSPITAL_COMMUNITY): Payer: Self-pay | Admitting: Interventional Radiology

## 2018-04-07 DIAGNOSIS — I729 Aneurysm of unspecified site: Secondary | ICD-10-CM

## 2018-04-07 DIAGNOSIS — I771 Stricture of artery: Secondary | ICD-10-CM

## 2018-04-20 ENCOUNTER — Telehealth: Payer: Self-pay | Admitting: Physician Assistant

## 2018-04-20 ENCOUNTER — Ambulatory Visit (HOSPITAL_COMMUNITY): Admission: RE | Admit: 2018-04-20 | Payer: Medicaid Other | Source: Ambulatory Visit

## 2018-04-20 NOTE — Telephone Encounter (Signed)
In person follow up cancelled today due to recent COVID-19 outbreak in attempt to limit face to face contact. Patient was called by Dr. Estanislado Pandy at 13:30 today to discuss CTA neck from 04/03/18.  Patient reports that she has had confusion at times - such as going into a room and forgetting why she was there and she also reports intermittent right foot numbness. She denies headaches, weakness, aphasia, dysphagia, facial droop, constant numbness or tingling in her extremities, claudication, difficulty walking, dyspnea, chest pain or vision changes. She continues to smoke approximately 1 pack of cigarettes per day. She has been taking ASA 81 mg QD.   Discussed results of CTA neck over the phone today - Dr. Estanislado Pandy informed patient that the previously known left PCOM aneurysm appears stable and that the right ICA stenosis also appears unchanged at 60-69%. Also noted very mild right arterial dilatation.  Discussed with patient plans for follow up to include: 1. CTA head/neck in 6 months to evaluate left PCOM aneurysm and right ICA stenosis.  2. Continue ASA 81 mg QD 3. Encouraged smoking cessation 4. Maintain DM, BP, HLD control 5. Call IR for worsening symptoms or present to ED for warning symptoms (facial droop, confusion, difficulty walking, weakness, numbness, etc.)  Candiss Norse, PA-C

## 2018-11-30 ENCOUNTER — Other Ambulatory Visit (HOSPITAL_COMMUNITY): Payer: Self-pay | Admitting: Interventional Radiology

## 2018-11-30 DIAGNOSIS — I771 Stricture of artery: Secondary | ICD-10-CM

## 2018-12-10 ENCOUNTER — Ambulatory Visit (HOSPITAL_COMMUNITY): Payer: Medicare HMO | Attending: Interventional Radiology

## 2018-12-10 ENCOUNTER — Encounter (HOSPITAL_COMMUNITY): Payer: Self-pay

## 2019-01-12 ENCOUNTER — Telehealth (HOSPITAL_COMMUNITY): Payer: Self-pay

## 2019-01-12 NOTE — Telephone Encounter (Signed)
Called to reschedule cta, no answer, vm full. AW

## 2019-02-08 ENCOUNTER — Telehealth (HOSPITAL_COMMUNITY): Payer: Self-pay

## 2019-02-08 NOTE — Telephone Encounter (Signed)
Called to reschedule cta, no answer, left vm. AW °

## 2019-03-03 ENCOUNTER — Encounter (HOSPITAL_COMMUNITY): Payer: Self-pay

## 2019-03-03 ENCOUNTER — Ambulatory Visit (HOSPITAL_COMMUNITY): Admission: RE | Admit: 2019-03-03 | Payer: Medicare HMO | Source: Ambulatory Visit

## 2019-04-21 IMAGING — MR MR MRA HEAD W/O CM
1 series · 19 of 48 positions shown · non-contrast
Comparison: Catheter angiogram 09/12/2016.  Head MRA 06/22/2015.

CLINICAL DATA: Follow-up cerebral aneurysm.

EXAM:
MRA HEAD WITHOUT CONTRAST
TECHNIQUE: Angiographic images of the Circle of Willis were obtained using MRA
technique without intravenous contrast.

[Series 5: 3d cow · axial · 0.5mm · 0.41mm/px · z∈[-87,-13]mm · 19 of 172 slices shown]
[im 1/172]
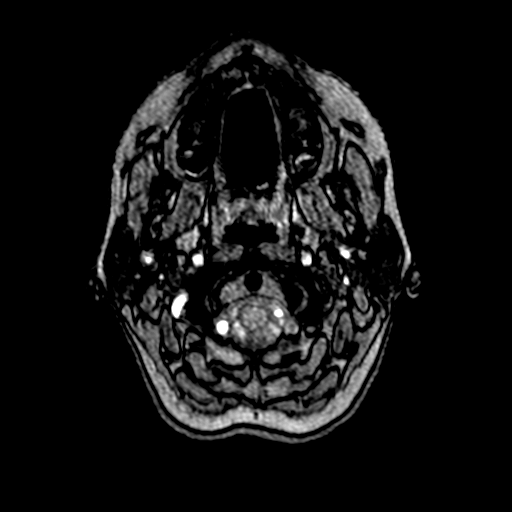
[im 4/172]
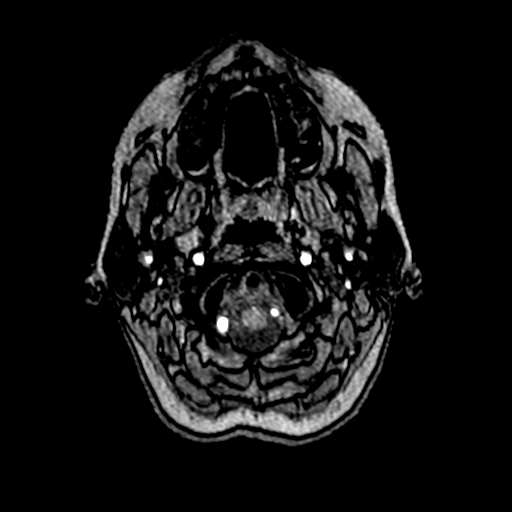
[im 8/172]
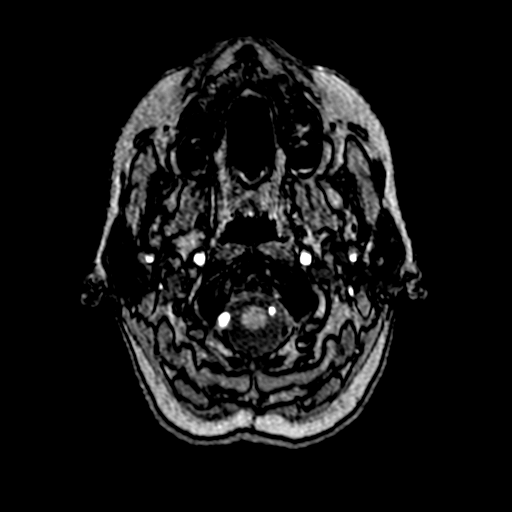
[im 11/172]
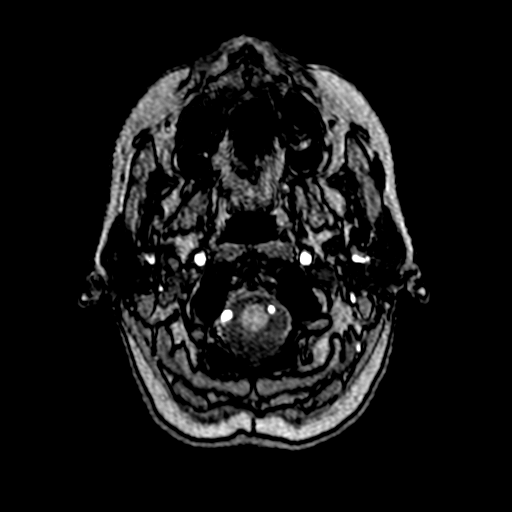
[im 15/172]
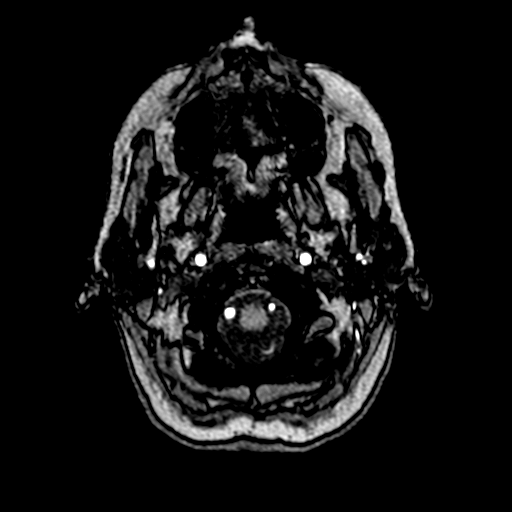
[im 19/172]
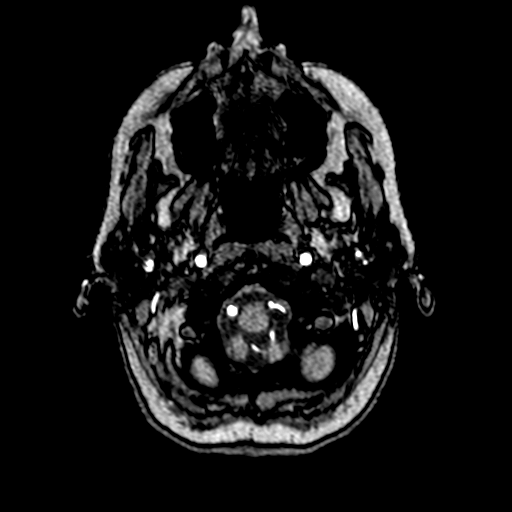
[im 22/172]
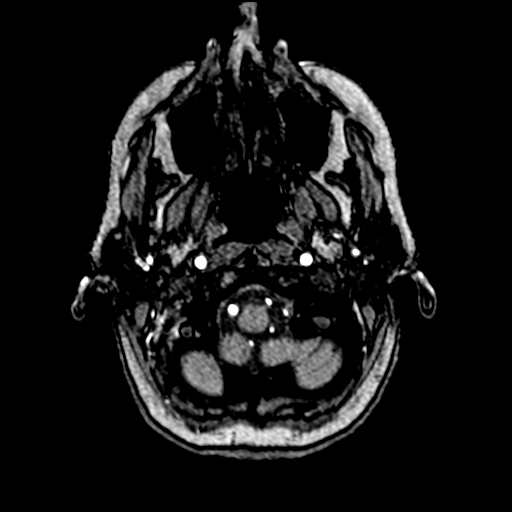
[im 26/172]
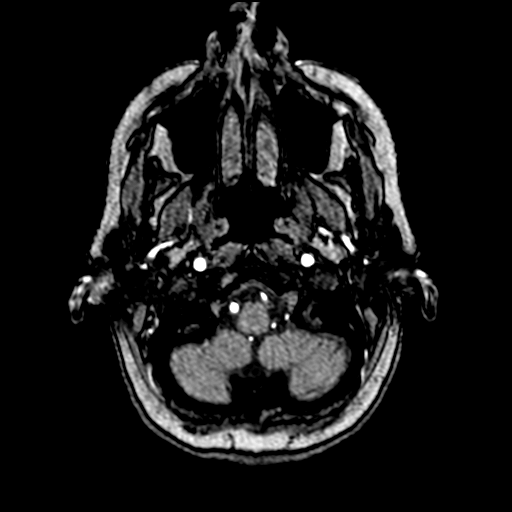
[im 30/172]
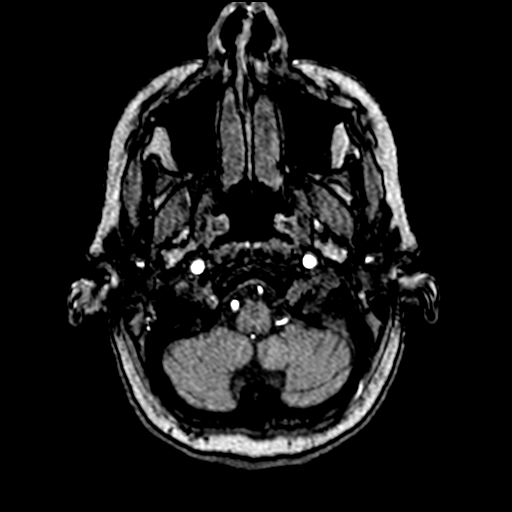
[im 33/172]
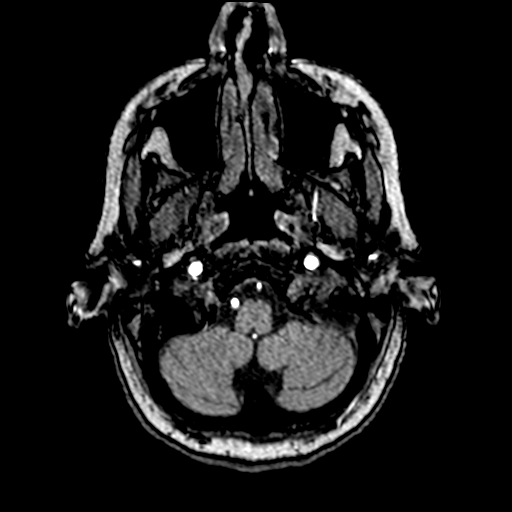
[im 37/172]
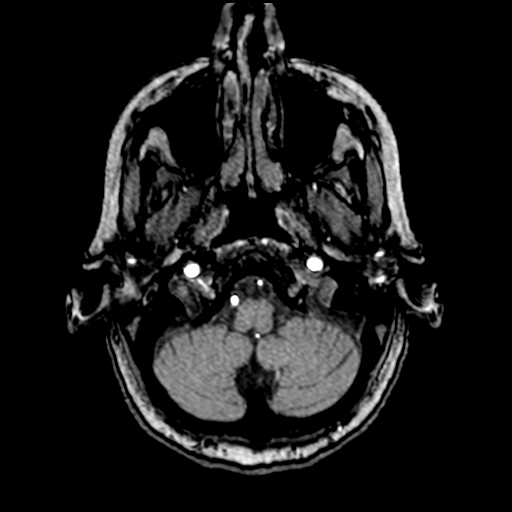
[im 55/172]
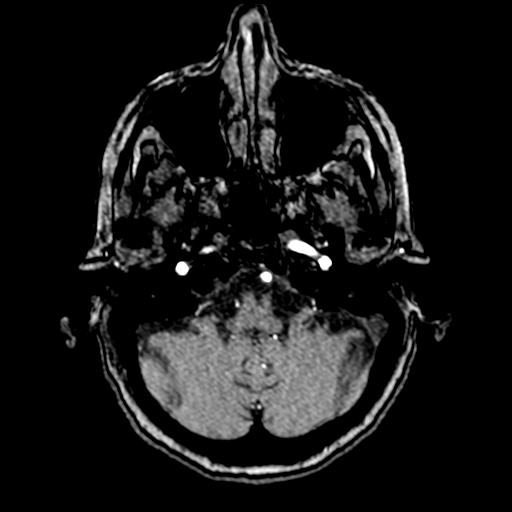
[im 77/172]
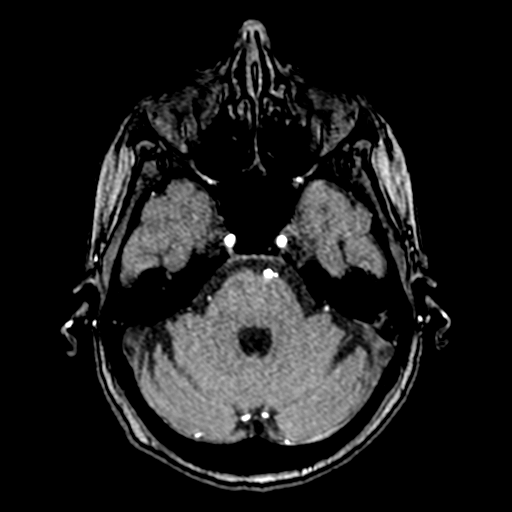
[im 88/172]
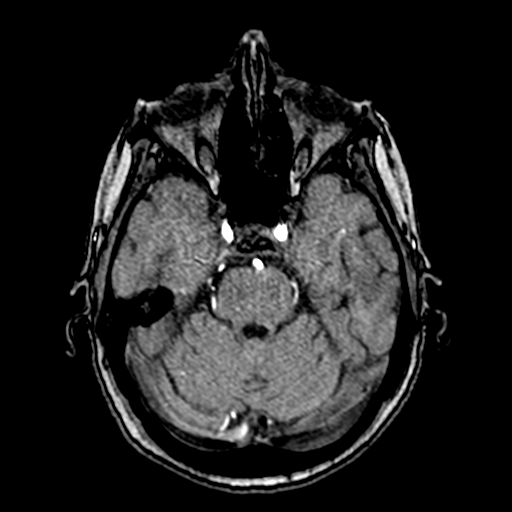
[im 99/172]
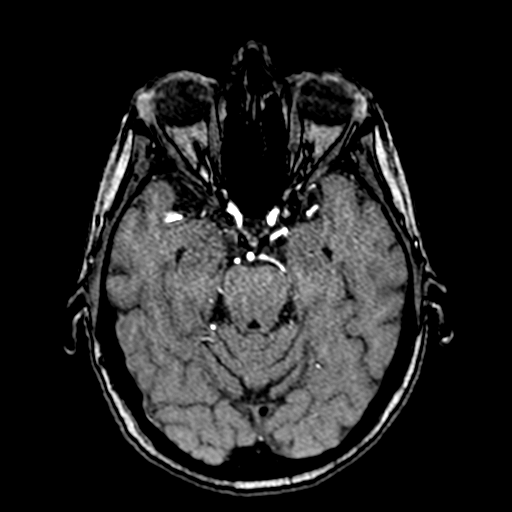
[im 121/172]
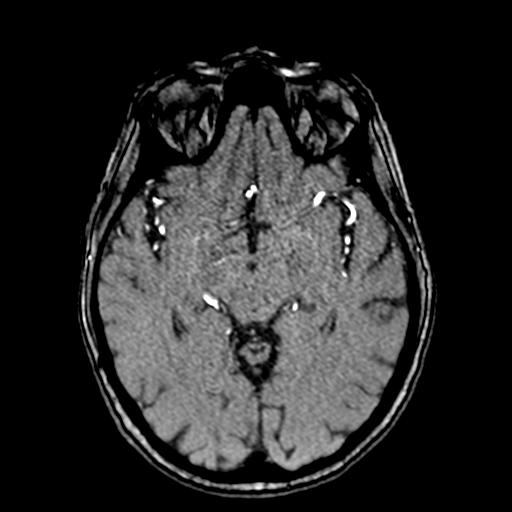
[im 142/172]
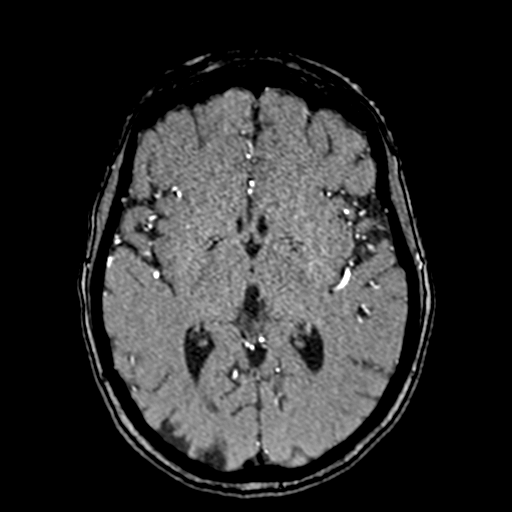
[im 146/172]
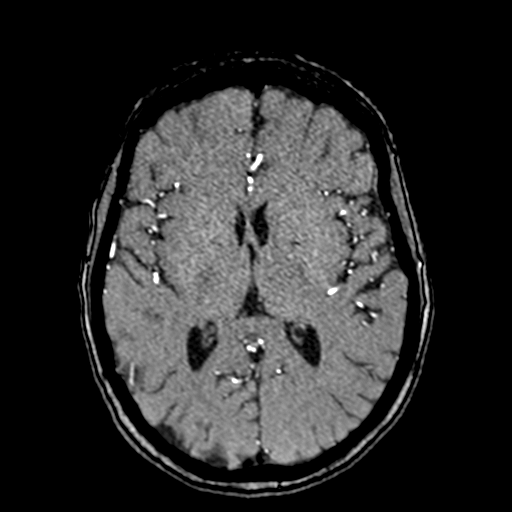
[im 164/172]
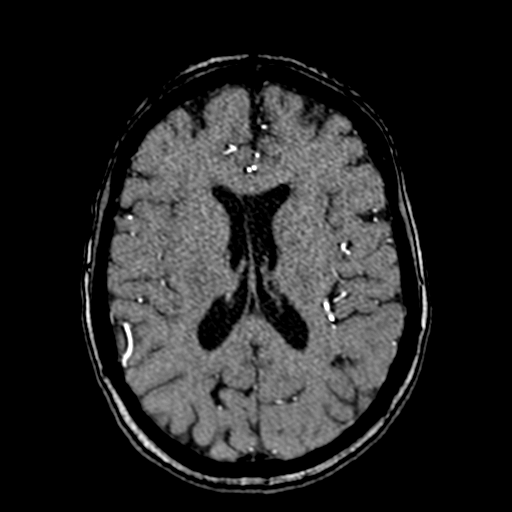

[19 of 48 positions shown; findings below may reference images not displayed]

FINDINGS: There is mild motion artifact.

The visualized distal vertebral arteries are widely patent to the
basilar with the right being dominant. Patent left PICA, bilateral
AICA, and bilateral SCA origins are identified. The basilar artery
is widely patent. There is a fetal origin of the left PCA. A 3 mm
aneurysm near the left posterior communicating artery origin is
unchanged. Both PCAs are patent without evidence of significant
stenosis.

The internal carotid arteries are patent from skull base to carotid
termini without evidence of significant stenosis. ACAs and MCAs are
patent without evidence of proximal branch occlusion or significant
stenosis. No new aneurysm is identified.
IMPRESSION: 1. Unchanged 3 mm left posterior communicating aneurysm.
2. No new aneurysm, major branch occlusion, or significant stenosis.

## 2020-02-05 HISTORY — PX: FRACTURE SURGERY: SHX138

## 2022-06-19 ENCOUNTER — Encounter (HOSPITAL_COMMUNITY): Payer: Self-pay | Admitting: Interventional Radiology

## 2022-06-20 ENCOUNTER — Telehealth (HOSPITAL_COMMUNITY): Payer: Self-pay

## 2022-06-20 NOTE — Telephone Encounter (Signed)
Called to schedule f/u, no answer, left vm. AB  

## 2022-06-24 ENCOUNTER — Other Ambulatory Visit (HOSPITAL_COMMUNITY): Payer: Self-pay | Admitting: Interventional Radiology

## 2022-06-24 ENCOUNTER — Telehealth (HOSPITAL_COMMUNITY): Payer: Self-pay

## 2022-06-24 DIAGNOSIS — I671 Cerebral aneurysm, nonruptured: Secondary | ICD-10-CM

## 2022-06-24 NOTE — Telephone Encounter (Signed)
Returned pt's call, no answer, left vm. AB  

## 2022-07-02 ENCOUNTER — Ambulatory Visit (HOSPITAL_COMMUNITY)
Admission: RE | Admit: 2022-07-02 | Discharge: 2022-07-02 | Disposition: A | Discharge status: Home / Self Care | Payer: Medicare HMO | Source: Ambulatory Visit | Attending: Interventional Radiology | Admitting: Interventional Radiology

## 2022-07-02 DIAGNOSIS — I671 Cerebral aneurysm, nonruptured: Secondary | ICD-10-CM

## 2022-07-03 ENCOUNTER — Other Ambulatory Visit (HOSPITAL_COMMUNITY): Payer: Self-pay | Admitting: Interventional Radiology

## 2022-07-03 DIAGNOSIS — I671 Cerebral aneurysm, nonruptured: Secondary | ICD-10-CM

## 2022-07-04 HISTORY — PX: IR RADIOLOGIST EVAL & MGMT: IMG5224

## 2022-07-10 ENCOUNTER — Other Ambulatory Visit: Payer: Self-pay | Admitting: Student

## 2022-07-10 DIAGNOSIS — I671 Cerebral aneurysm, nonruptured: Secondary | ICD-10-CM

## 2022-07-11 ENCOUNTER — Other Ambulatory Visit (HOSPITAL_COMMUNITY): Payer: Self-pay | Admitting: Interventional Radiology

## 2022-07-11 ENCOUNTER — Ambulatory Visit (HOSPITAL_COMMUNITY)
Admission: RE | Admit: 2022-07-11 | Discharge: 2022-07-11 | Disposition: A | Discharge status: Home / Self Care | Payer: 59 | Source: Ambulatory Visit | Attending: Interventional Radiology | Admitting: Interventional Radiology

## 2022-07-11 ENCOUNTER — Encounter (HOSPITAL_COMMUNITY): Payer: Self-pay

## 2022-07-11 DIAGNOSIS — I671 Cerebral aneurysm, nonruptured: Secondary | ICD-10-CM

## 2022-07-11 DIAGNOSIS — F17210 Nicotine dependence, cigarettes, uncomplicated: Secondary | ICD-10-CM | POA: Diagnosis not present

## 2022-07-11 DIAGNOSIS — I6521 Occlusion and stenosis of right carotid artery: Secondary | ICD-10-CM | POA: Diagnosis not present

## 2022-07-11 HISTORY — PX: IR ANGIO VERTEBRAL SEL VERTEBRAL UNI R MOD SED: IMG5368

## 2022-07-11 HISTORY — PX: IR 3D INDEPENDENT WKST: IMG2385

## 2022-07-11 HISTORY — PX: IR ANGIO VERTEBRAL SEL SUBCLAVIAN INNOMINATE UNI L MOD SED: IMG5364

## 2022-07-11 HISTORY — PX: IR ANGIO INTRA EXTRACRAN SEL COM CAROTID INNOMINATE BILAT MOD SED: IMG5360

## 2022-07-11 LAB — CBC WITH DIFFERENTIAL/PLATELET
Abs Immature Granulocytes: 0.03 10*3/uL (ref 0.00–0.07)
Basophils Absolute: 0.1 10*3/uL (ref 0.0–0.1)
Basophils Relative: 1 %
Eosinophils Absolute: 0.2 10*3/uL (ref 0.0–0.5)
Eosinophils Relative: 4 %
HCT: 38.4 % (ref 36.0–46.0)
Hemoglobin: 13.2 g/dL (ref 12.0–15.0)
Immature Granulocytes: 1 %
Lymphocytes Relative: 39 %
Lymphs Abs: 1.8 10*3/uL (ref 0.7–4.0)
MCH: 31.1 pg (ref 26.0–34.0)
MCHC: 34.4 g/dL (ref 30.0–36.0)
MCV: 90.4 fL (ref 80.0–100.0)
Monocytes Absolute: 0.4 10*3/uL (ref 0.1–1.0)
Monocytes Relative: 9 %
Neutro Abs: 2.1 10*3/uL (ref 1.7–7.7)
Neutrophils Relative %: 46 %
Platelets: 266 10*3/uL (ref 150–400)
RBC: 4.25 MIL/uL (ref 3.87–5.11)
RDW: 13.4 % (ref 11.5–15.5)
WBC: 4.6 10*3/uL (ref 4.0–10.5)
nRBC: 0 % (ref 0.0–0.2)

## 2022-07-11 LAB — PROTIME-INR
INR: 0.9 (ref 0.8–1.2)
Prothrombin Time: 12.2 seconds (ref 11.4–15.2)

## 2022-07-11 LAB — BASIC METABOLIC PANEL
Anion gap: 11 (ref 5–15)
BUN: <5 mg/dL — ABNORMAL LOW (ref 8–23)
CO2: 23 mmol/L (ref 22–32)
Calcium: 8.9 mg/dL (ref 8.9–10.3)
Chloride: 94 mmol/L — ABNORMAL LOW (ref 98–111)
Creatinine, Ser: 0.52 mg/dL (ref 0.44–1.00)
GFR, Estimated: >60 mL/min (ref >60)
Glucose, Bld: 79 mg/dL (ref 70–99)
Potassium: 3.7 mmol/L (ref 3.5–5.1)
Sodium: 128 mmol/L — ABNORMAL LOW (ref 135–145)

## 2022-07-11 MED ORDER — MIDAZOLAM HCL 2 MG/2ML IJ SOLN
INTRAMUSCULAR | Status: AC
  Filled 2022-07-11: qty 2

## 2022-07-11 MED ORDER — FENTANYL CITRATE (PF) 100 MCG/2ML IJ SOLN
INTRAMUSCULAR | Status: AC
  Filled 2022-07-11: qty 2

## 2022-07-11 MED ORDER — IOHEXOL 300 MG/ML  SOLN
100.0000 mL | Freq: Once | INTRAMUSCULAR | Status: AC | PRN
  Administered 2022-07-11: 30 mL via INTRA_ARTERIAL

## 2022-07-11 MED ORDER — SODIUM CHLORIDE 0.9 % IV SOLN: INTRAVENOUS | Status: AC

## 2022-07-11 MED ORDER — LIDOCAINE HCL 1 % IJ SOLN
20.0000 mL | Freq: Once | INTRAMUSCULAR | Status: AC
  Administered 2022-07-11: 10 mL via INTRADERMAL

## 2022-07-11 MED ORDER — SODIUM CHLORIDE 0.9 % IV SOLN: INTRAVENOUS | Status: DC

## 2022-07-11 MED ORDER — MIDAZOLAM HCL 2 MG/2ML IJ SOLN
INTRAMUSCULAR | Status: AC | PRN
  Administered 2022-07-11: 1 mg via INTRAVENOUS

## 2022-07-11 MED ORDER — NITROGLYCERIN 1 MG/10 ML FOR IR/CATH LAB
INTRA_ARTERIAL | Status: AC
  Filled 2022-07-11: qty 10

## 2022-07-11 MED ORDER — FENTANYL CITRATE (PF) 100 MCG/2ML IJ SOLN
INTRAMUSCULAR | Status: AC | PRN
  Administered 2022-07-11: 25 ug via INTRAVENOUS

## 2022-07-11 MED ORDER — HEPARIN SODIUM (PORCINE) 1000 UNIT/ML IJ SOLN
INTRAMUSCULAR | Status: AC
  Filled 2022-07-11: qty 10

## 2022-07-11 MED ORDER — LIDOCAINE HCL 1 % IJ SOLN: 10.0000 mL | Freq: Once | INTRAMUSCULAR | Status: DC

## 2022-07-11 MED ORDER — LIDOCAINE HCL 1 % IJ SOLN
INTRAMUSCULAR | Status: AC
  Filled 2022-07-11: qty 20

## 2022-07-11 MED ORDER — VERAPAMIL HCL 2.5 MG/ML IV SOLN
INTRAVENOUS | Status: AC
  Filled 2022-07-11: qty 2

## 2022-07-11 MED ORDER — IOHEXOL 300 MG/ML  SOLN
150.0000 mL | Freq: Once | INTRAMUSCULAR | Status: AC | PRN
  Administered 2022-07-11: 100 mL via INTRA_ARTERIAL

## 2022-07-11 MED ORDER — IOHEXOL 300 MG/ML  SOLN
100.0000 mL | Freq: Once | INTRAMUSCULAR | Status: AC | PRN
  Administered 2022-07-11: 42 mL via INTRA_ARTERIAL

## 2022-07-11 MED ORDER — HEPARIN SODIUM (PORCINE) 1000 UNIT/ML IJ SOLN
INTRAMUSCULAR | Status: AC | PRN
  Administered 2022-07-11: 1000 [IU] via INTRAVENOUS

## 2022-07-11 NOTE — Procedures (Signed)
INR.  Status post four-vessel cerebral arteriogram.  Right CFA approach.  Findings.  1.  High-grade stenosis of the proximal right ICA.  2.  Approximately 4.9 mm x 3.6 mm left ICA/posterior communicating artery junction aneurysm.  Fatima Sanger MD.

## 2022-07-11 NOTE — Sedation Documentation (Signed)
Exoseal deployed 

## 2022-07-11 NOTE — H&P (Signed)
Chief Complaint: Patient was seen in consultation today for Cerebral arteriogram at the request of Deveshwar,Sanjeev  Referring Physician(s): Deveshwar,Sanjeev  Supervising Physician: Julieanne Cotton  Patient Status: [facility] - Out-pt  History of Present Illness: Kristen Knox is a 62 y.o. 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 Worsening internal carotid artery stenosis by ultrasound according to the patient.  Recent consultation with Dr Corliss Skains 07/02/22 Patient reports that there has been a worsening of the right internal carotid artery stenosis by ultrasound performed by her primary doctor recently. She denies any recent symptoms of speech difficulties, facial weakness, limb weakness, paresthesias, incoordination or of gait instability. She denies any visual symptoms of amaurosis fugax, diplopia, blurred vision, or of sudden severe worsening headaches. 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 The patient having had a previous diagnostic catheter arteriogram is amenable to proceed with a diagnostic catheter arteriogram of the carotids extra cranially and intracranially.    Scheduled today for Cerebral arteriogram  Past Surgical History:  Procedure Laterality Date   BREAST SURGERY Right 2014   mastectomy   CHOLECYSTECTOMY     IR 3D INDEPENDENT WKST  09/12/2016   IR ANGIO INTRA EXTRACRAN SEL COM CAROTID INNOMINATE BILAT MOD SED  09/12/2016   IR ANGIO VERTEBRAL SEL SUBCLAVIAN INNOMINATE UNI L MOD SED  09/12/2016   IR ANGIO VERTEBRAL SEL VERTEBRAL UNI R MOD SED  09/12/2016    IR GENERIC HISTORICAL  09/05/2015   IR RADIOLOGIST EVAL & MGMT 09/05/2015 [facility]INTER[facility]   IR RADIOLOGIST EVAL & MGMT  10/23/2016   IR RADIOLOGIST EVAL & MGMT  07/04/2022   MASTECTOMY     TUBAL LIGATION      Allergies: Patient has no known allergies.  Medications: Prior to Admission medications   Medication Sig Start Date End Date Taking? Authorizing Provider  ALPRAZolam Prudy Feeler) 1 MG tablet Take 1 mg by mouth 3 (three) times daily as needed for anxiety.   Yes [provider]  aspirin 81 MG chewable tablet Chew 81 mg by mouth daily.   Yes [provider]  atorvastatin (LIPITOR) 40 MG tablet Take 40 mg by mouth daily.   Yes [provider]  FLUoxetine (PROZAC) 40 MG capsule Take 40 mg by mouth daily.   Yes [provider]  lisinopril (PRINIVIL,ZESTRIL) 40 MG tablet Take 40 mg by mouth daily.   Yes [provider]     History reviewed. No pertinent family history.  Social History   Socioeconomic History   Marital status: Widowed    Spouse name: Not on file   Number of children: Not on file   Years of education: Not on file   Highest education level: Not on file  Occupational History   Not on file  Tobacco Use   Smoking status: Every Day   Smokeless tobacco: Not on file  Substance and Sexual Activity   Alcohol use: Yes   Drug use: Not on file   Sexual activity: Not on file  Other Topics Concern   Not on file  Social History Narrative   Not on file   Social Determinants of Health   Financial Resource Strain: Not  on file  Food Insecurity: Not on file  Transportation Needs: Not on file  Physical Activity: Not on file  Stress: Not on file  Social Connections: Not on file    Review of Systems: A 12 point ROS discussed and pertinent positives are indicated in the HPI above.  All other systems are negative.  Review of Systems  Constitutional:  Negative for activity change, fatigue and fever.  HENT:  Negative for tinnitus and  trouble swallowing.   Eyes:  Negative for visual disturbance.  Respiratory:  Negative for cough and shortness of breath.   Cardiovascular:  Negative for chest pain.  Gastrointestinal:  Negative for abdominal pain, nausea and vomiting.  Musculoskeletal:  Negative for gait problem.  Neurological:  Negative for dizziness, tremors, seizures, syncope, facial asymmetry, speech difficulty, weakness, light-headedness, numbness and headaches.  Psychiatric/Behavioral:  Negative for behavioral problems and confusion.     Vital Signs: BP 127/88   Pulse 69   Temp 98.1 F (36.7 C) (Temporal)   Resp 14   Ht 5\' 2"  (1.575 m)   Wt 110 lb (49.9 kg)   SpO2 98%   BMI 20.12 kg/m     Physical Exam Vitals reviewed.  HENT:     Mouth/Throat:     Mouth: Mucous membranes are moist.  Cardiovascular:     Rate and Rhythm: Normal rate and regular rhythm.     Heart sounds: Normal heart sounds.  Pulmonary:     Effort: Pulmonary effort is normal.     Breath sounds: Normal breath sounds.  Abdominal:     Palpations: Abdomen is soft.     Tenderness: There is no abdominal tenderness.  Musculoskeletal:        General: Normal range of motion.     Right lower leg: No edema.     Left lower leg: No edema.  Skin:    General: Skin is warm.  Neurological:     Mental Status: She is alert and oriented to person, place, and time.  Psychiatric:        Behavior: Behavior normal.     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 Worsening internal carotid artery stenosis by ultrasound according to the patient. Current Pain Level: 1-10 HISTORY OF PRESENT ILLNESS: Patient is a 62 year old right handed lady who has a history of a left posterior communicating artery/left ICA junction 3 mm aneurysm seen on diagnostic catheter arteriogram of  09/12/2016 and also of approximately 65% stenosis of the right ICA. She returns today accompanied with a close friend. Clinically, the patient reports that there has been a worsening of the right internal carotid artery stenosis by ultrasound performed by her primary doctor recently. She denies any recent symptoms of speech difficulties, facial weakness, limb weakness, paresthesias, incoordination or of gait instability. She denies any visual symptoms of amaurosis fugax, diplopia, blurred vision, or of sudden severe worsening headaches. She reports no episodes of loss of awareness, or of seizure-like activity. On close questioning, she also reports no recent chest pain, shortness of breath or palpitations or pedal edema. She reports no wheezing, coughing, or of hemoptysis. She reports no difficulty with swallowing liquids or solids, or of abdominal pain, constipation or diarrhea. She denies no recent history of dysuria, hematuria or polyuria. She reports her weight to be steady. She reports appetite to be unchanged. Past Medical History: Hypertension, hyperlipidemia, depression, anxiety  attacks. Medications: Lisinopril, Crestor, Prozac, alprazolam for anxiety, and aspirin 81 mg per day. Allergies: None known. Social History: Smokes a pack a day and has for 50 years. She admits to drinking 6-8 cans of beer night for 10 years. She denies any illicit use of chemicals. Family History: Unchanged. REVIEW OF SYSTEMS: Negative unless as mentioned above. PHYSICAL EXAMINATION: Appears in no acute distress. Alert, awake, oriented to time, place, space. Speech and comprehension intact. No gross abnormal neurological lateralizing features seen. Station and gait intact. 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 The patient having had a previous diagnostic catheter arteriogram is amenable to proceed with a diagnostic catheter arteriogram of the carotids extra cranially and intracranially. This will be scheduled as soon as possible. Patient advised to maintain adequate hydration. Patient and her friend leave with good understanding and agreement with the above management plan. Electronically Signed   By: Julieanne Cotton M.D.   On: 07/04/2022 07:53    Labs:  CBC: Recent Labs    07/11/22 0835  WBC 4.6  HGB 13.2  HCT 38.4  PLT 266    COAGS: Recent Labs    07/11/22 0835  INR 0.9    BMP: Recent Labs    07/11/22 0835  NA 128*  K 3.7  CL 94*  CO2 23  GLUCOSE 79  BUN <5*  CALCIUM 8.9  CREATININE 0.52  GFRNONAA >60    LIVER FUNCTION TESTS: No results for input(s): "BILITOT", "AST", "ALT", "ALKPHOS", "PROT", "ALBUMIN" in the last 8760 hours.  TUMOR MARKERS: No results for input(s): "AFPTM", "CEA", "CA199", "CHROMGRNA" in the last 8760 hours.  Assessment and Plan:  Scheduled for Cerebral arteriogram Risks and benefits of cerebral angiogram with intervention were discussed with the patient including, but not limited to bleeding, infection, vascular injury, contrast induced renal failure, stroke or even death.  This interventional procedure involves the use of X-rays and because of the nature of the planned procedure, it is possible that we will have prolonged use of X-ray fluoroscopy.  Potential radiation risks to you include (but are not limited to) the following: - A slightly elevated risk for cancer  several years later in life. This risk is typically less than 0.5% percent. This risk is low in comparison to the normal incidence of human cancer, which is 33% for women and 50% for men according to the [employer]. - Radiation induced injury can include skin redness, resembling a rash, tissue breakdown /  ulcers and hair loss (which can be temporary or permanent).   The likelihood of either of these occurring depends on the difficulty of the procedure and whether you are sensitive to radiation due to previous procedures, disease, or genetic conditions.   IF your procedure requires a prolonged use of radiation, you will be notified and given written instructions for further action.  It is your responsibility to monitor the irradiated area for the 2 weeks following the procedure and to notify your physician if you are concerned that you have suffered a radiation induced injury.    All of the patient's questions were answered, patient is agreeable to proceed.  Consent signed and in chart.  Thank you for this interesting consult.  I greatly enjoyed meeting [employer] and look  forward to participating in their care.  A copy of this report was sent to the requesting provider on this date.  Electronically Signed: Robet Leu, PA-C 07/11/2022, 9:43 AM   I spent a total of  30 Minutes   in face to face in clinical consultation, greater than 50% of which was counseling/coordinating care for Cerebral arteriogram

## 2022-07-15 ENCOUNTER — Other Ambulatory Visit (HOSPITAL_COMMUNITY): Payer: Self-pay | Admitting: Interventional Radiology

## 2022-07-15 DIAGNOSIS — I671 Cerebral aneurysm, nonruptured: Secondary | ICD-10-CM

## 2022-07-16 ENCOUNTER — Ambulatory Visit (HOSPITAL_COMMUNITY)
Admission: RE | Admit: 2022-07-16 | Discharge: 2022-07-16 | Disposition: A | Discharge status: Home / Self Care | Payer: 59 | Source: Ambulatory Visit | Attending: Interventional Radiology | Admitting: Interventional Radiology

## 2022-07-16 DIAGNOSIS — I671 Cerebral aneurysm, nonruptured: Secondary | ICD-10-CM

## 2022-07-18 HISTORY — PX: IR RADIOLOGIST EVAL & MGMT: IMG5224

## 2022-07-23 ENCOUNTER — Other Ambulatory Visit (HOSPITAL_COMMUNITY): Payer: Self-pay | Admitting: Interventional Radiology

## 2022-07-23 DIAGNOSIS — I671 Cerebral aneurysm, nonruptured: Secondary | ICD-10-CM

## 2022-07-25 ENCOUNTER — Other Ambulatory Visit: Payer: Self-pay | Admitting: Physician Assistant

## 2022-07-25 ENCOUNTER — Encounter (HOSPITAL_COMMUNITY): Payer: Self-pay

## 2022-07-25 MED ORDER — TICAGRELOR 90 MG PO TABS: 90.0000 mg | ORAL_TABLET | Freq: Two times a day (BID) | ORAL | 2 refills | Status: DC

## 2022-08-06 NOTE — H&P (Signed)
  The note originally documented on this encounter has been moved the the encounter in which it belongs.  

## 2022-08-06 NOTE — H&P (Signed)
Chief Complaint: Patient was seen in consultation today for brain aneurysm  Supervising Physician: Julieanne Cotton  Patient Status: [facility] - Out-pt  History of Present Illness: Kristen Knox is a 62 y.o. female with a medical history significant for anxiety/depression, asthma, right breast cancer, HTN and CVA. She is familiar to IR from prior angiograms 08/15/15, 09/12/16 and 07/11/22. 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   She met with Dr. Corliss Skains 07/16/22 to discuss these results and Dr. Corliss Skains discussed endovascular treatment versus conservative management. After explaining the risks, benefits and alternatives the patient expressed a desire to proceed with endovascular treatment. She was made aware that the procedure will be done under general anesthesia with planned overnight observation. Dr. Corliss Skains ordered Brilinta to be started 5 days prior to the procedure. The patient already takes a daily 81 mg aspirin.   Past Medical History:  Diagnosis Date   Anemia    Anxiety    Asthma    Cancer (HCC) 02/05/2012   Rt breast Ca   Depression    Headache    Hypertension    Stroke [facility])     Past Surgical History:  Procedure Laterality Date   BREAST SURGERY Right 02/05/2012   mastectomy   CHOLECYSTECTOMY     FRACTURE SURGERY Left 2022   wrist   IR 3D INDEPENDENT [facility]ST  09/12/2016   IR 3D INDEPENDENT [facility]ST  07/11/2022   IR ANGIO INTRA EXTRACRAN SEL COM CAROTID INNOMINATE BILAT MOD SED  09/12/2016   IR ANGIO INTRA EXTRACRAN SEL COM CAROTID INNOMINATE BILAT MOD SED  07/11/2022   IR ANGIO VERTEBRAL SEL SUBCLAVIAN INNOMINATE UNI L MOD SED  09/12/2016   IR ANGIO VERTEBRAL SEL SUBCLAVIAN INNOMINATE UNI L MOD SED  07/11/2022   IR ANGIO VERTEBRAL SEL VERTEBRAL UNI R MOD SED  09/12/2016   IR ANGIO VERTEBRAL SEL VERTEBRAL UNI R MOD SED  07/11/2022    IR GENERIC HISTORICAL  09/05/2015   IR RADIOLOGIST EVAL & MGMT 09/05/2015 [facility]-INTERV RAD   IR RADIOLOGIST EVAL & MGMT  10/23/2016   IR RADIOLOGIST EVAL & MGMT  07/04/2022   IR RADIOLOGIST EVAL & MGMT  07/18/2022   MASTECTOMY     TUBAL LIGATION      Allergies: Patient has no known allergies.  Medications: Prior to Admission medications   Medication Sig Start Date End Date Taking? Authorizing Provider  alprazolam Prudy Feeler) 2 MG tablet Take 2 mg by mouth 2 (two) times daily.    [provider]  aspirin 81 MG chewable tablet Chew 81 mg by mouth daily.    [provider]  ergocalciferol (VITAMIN D2) 1.25 MG (50000 UT) capsule Take 50,000 Units by mouth once a week. 07/19/20   [provider]  FLUoxetine (PROZAC) 40 MG capsule Take 40 mg by mouth daily.    [provider]  ibuprofen (ADVIL) 200 MG tablet Take 400 mg by mouth every 6 (six) hours as needed for headache, mild pain or moderate pain (Back pain).    [provider]  lisinopril (PRINIVIL,ZESTRIL) 40 MG tablet Take 40 mg by mouth daily.    [provider]  PROLIA 60 MG/ML SOSY injection Inject 60 mg into the skin every 6 (six) months. 04/24/22   [provider]  rosuvastatin (CRESTOR) 20 MG tablet Take 20 mg by mouth daily. 05/25/21   [provider]  ticagrelor (BRILINTA) 90 MG TABS tablet Take 1 tablet (90 mg total) by mouth 2 (two) times daily. 07/25/22   Gershon Crane, PA-C     No family history on file.  Social History   Socioeconomic History   Marital status: Widowed    Spouse name: Not on file   Number of children: Not on file   Years of education: Not on file   Highest education level: Not on file  Occupational History   Not on file  Tobacco Use   Smoking status: Every Day    Packs/day: 1.00    Years: 50.00    Additional pack years: 0.00    Total pack years: 50.00    Types: Cigarettes   Smokeless tobacco: Not on file  Vaping Use   Vaping  Use: Never used  Substance and Sexual Activity   Alcohol use: Yes    Alcohol/week: 28.0 standard drinks of alcohol    Types: 28 Cans of beer per week    Comment: 4-6 beers a night   Drug use: Never   Sexual activity: Not on file  Other Topics Concern   Not on file  Social History Narrative   Not on file   Social Determinants of Health   Financial Resource Strain: Not on file  Food Insecurity: Not on file  Transportation Needs: Not on file  Physical Activity: Not on file  Stress: Not on file  Social Connections: Not on file    Review of Systems: A 12 point ROS discussed and pertinent positives are indicated in the HPI above.  All other systems are negative.  Review of Systems  Constitutional:  Negative for appetite change and fatigue.  Respiratory:  Negative for cough and shortness of breath.   Cardiovascular:  Negative for chest pain and leg swelling.  Gastrointestinal:  Negative for abdominal pain, diarrhea, nausea and vomiting.  Musculoskeletal:  Negative for back pain.  Neurological:  Negative for dizziness, weakness and headaches.    Vital Signs: 98.2, 106/71, 79, 18, 98% on room air   Physical Exam Constitutional:      General: She is not in acute distress.    Appearance: She is not ill-appearing.  HENT:     Mouth/Throat:     Mouth: Mucous membranes are moist.     Pharynx: Oropharynx is clear.  Cardiovascular:     Rate and Rhythm: Normal rate and regular rhythm.     Pulses: Normal pulses.     Heart sounds: Normal heart sounds.  Pulmonary:     Effort: Pulmonary effort is normal.     Breath sounds: Normal breath sounds.  Abdominal:     General: Bowel sounds are normal.     Palpations: Abdomen is soft.     Tenderness: There is no abdominal tenderness.  Musculoskeletal:     Right lower leg: No edema.     Left lower leg: No edema.  Skin:    General: Skin is warm and dry.  Neurological:     Mental Status: She is alert and oriented to person, place, and  time.  Psychiatric:        Mood and Affect: Mood normal.        Behavior: Behavior normal.        Thought Content: Thought content normal.        Judgment: Judgment normal.     Imaging: IR Radiologist Eval & Mgmt  Result Date: 07/18/2022 EXAM: ESTABLISHED PATIENT OFFICE VISIT CHIEF COMPLAINT: Follow-up visit. Current Pain  Level: 1-10 HISTORY OF PRESENT ILLNESS: 62 year old right handed lady who presents in follow-up accompanied by her family member. Patient with findings of a recent cerebral arteriogram performed on 07/11/22. Since the arteriogram, the patient reports no changes in her clinical condition. She denies any significant changes in review of systems since her diagnostic arteriogram of 07/11/2022. Brought to her attention, and the family member, was the fine severe high-grade stenosis of the proximal right ICA of 90%. Also discussed was the important finding of a 4.9 mm x 3.6 mm saccular aneurysm arising at the junction of the left posterior communicating artery with the left internal carotid artery. The aneurysm measured less than 3 mm on a diagnostic arteriogram 09/12/2016. Again reviewed was the natural history of unruptured brain aneurysms with the risk of rupture of 1-2% per year per attendant significant mortality and morbidity related to a ruptured aneurysm. Increased risk of rupture has been strongly associated with smoking, hypertension, and family history the latter of which is not applicable to the patient. Given the increase in size of the aneurysm associated with the history of hypertension and smoking, makes this and an unstable aneurysm with a significantly increased risk of rupture. Obliteration of flow into the aneurysm via endovascular route was reviewed in detail. This may involve primary coiling with stent assistance versus use of possible flow diversion stent at the origin of the posterior communicating artery. The procedure would be under general anesthesia via a trans  radial or transfemoral route. Risk of complication of thromboembolic stroke, intraprocedural rupture, with the attendant potential for worsening neurological condition and possible fatality was reviewed. Risk of vessel injury at the groin, with development of hematoma, contrast induced nephropathy were also reviewed. The patient will be started on Brilinta 5 days prior to the procedure 90 mg b.i.d. or Plavix 300 mg loading dose, followed by 75 mg once a day also 7 days prior to the procedure. The patient is already on a baby aspirin a day. Patient would spend an overnight stay in the neuro ICU following the treatment. Barring any complications, the patient would be discharged the following day. 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 The high-grade worsening stenosis of the right ICA proximal will be undertaken a few weeks after the aneurysm treatment. Past Medical History: Unchanged. Medications: Unchanged. Allergies: Unchanged. Social History: Unchanged. Family History:  Unchanged. REVIEW OF SYSTEMS: Negative unless as mentioned above. PHYSICAL EXAMINATION: Neurologically grossly intact. ASSESSMENT AND PLAN: As above. Patient strongly advised to refrain from smoking. The patient also advised to maintain adequate hydration. Procedure to be scheduled with general anesthesia as soon as possible. Electronically Signed   By: Julieanne Cotton M.D.   On: 07/18/2022 07:56    Labs:  CBC: Recent Labs    07/11/22 0835 08/12/22 0707  WBC 4.6 4.8  HGB 13.2 11.3*  HCT 38.4 34.3*  PLT 266 281    COAGS: Recent Labs    07/11/22 0835 08/12/22 0655  INR 0.9 0.9    BMP: Recent Labs    07/11/22 0835 08/12/22 0655  NA 128* 129*  K 3.7 3.8  CL 94* 99  CO2 23 18*  GLUCOSE 79 86  BUN <5* <5*  CALCIUM 8.9 8.9  CREATININE 0.52 0.47  GFRNONAA >60 >60    LIVER FUNCTION TESTS: Recent  Labs    08/12/22 0655  BILITOT 0.6  AST 40  ALT 23  ALKPHOS 42  PROT 6.9  ALBUMIN 4.0    TUMOR MARKERS: No results for input(s): "AFPTM", "CEA", "CA199", "CHROMGRNA" in the last 8760 hours.  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 This procedure will be done under general anesthesia with planned overnight observation.   Risks and benefits of this procedure were discussed with the patient including, but not limited to bleeding, infection, vascular injury or contrast induced renal failure.  This interventional procedure involves the use of X-rays and because of the nature of the planned procedure, it is possible that we will have prolonged use of X-ray fluoroscopy.  Potential radiation risks to you include (but are not limited to) the following: - A slightly elevated risk for cancer  several years later in life. This risk is typically less than 0.5% percent. This risk is low in comparison to the normal incidence of human cancer, which is 33% for women and 50% for men according to the [employer] Society. - Radiation induced injury can include skin redness, resembling a rash, tissue breakdown / ulcers and hair loss (which can be temporary or permanent).   The likelihood of either of these occurring depends on the difficulty of the procedure and whether you are sensitive to radiation due to previous procedures, disease, or genetic conditions.   IF your procedure requires a prolonged use of radiation, you will be notified and given written instructions for further action.  It is your responsibility to monitor the irradiated area for the 2 weeks following the procedure and to notify your physician if you are concerned  that you have suffered a radiation induced injury.    All of the patient's questions were answered, patient is agreeable to proceed. She has been NPO. She is a full code. She took Brilinta and Aspirin this morning.   Consent signed and in chart.   Thank you for this interesting consult.  I greatly enjoyed meeting Kristen Knox and look forward to participating in their care.  A copy of this report was sent to the requesting provider on this date.  Electronically Signed: Alwyn Ren, AGACNP-BC [phone] 08/12/2022, 8:06 AM   I spent a total of  30 Minutes   in face to face in clinical consultation, greater than 50% of which was counseling/coordinating care for endovascular treatment of cerebral aneurysm.

## 2022-08-07 ENCOUNTER — Other Ambulatory Visit: Payer: Self-pay | Admitting: Student

## 2022-08-07 ENCOUNTER — Encounter (HOSPITAL_COMMUNITY): Payer: Self-pay | Admitting: Interventional Radiology

## 2022-08-07 ENCOUNTER — Other Ambulatory Visit: Payer: Self-pay

## 2022-08-07 DIAGNOSIS — Z419 Encounter for procedure for purposes other than remedying health state, unspecified: Secondary | ICD-10-CM

## 2022-08-07 NOTE — Progress Notes (Signed)
SDW CALL  Patient was given pre-op instructions over the phone. The opportunity was given for the patient to ask questions. No further questions asked. Patient verbalized understanding of instructions given.   PCP - Roger Kill, [facility]  Cardiologist - denies  PPM/ICD - denies Device Orders -  Rep Notified -   Chest x-ray- EKG - faxed PCP; DOS if tracing not recieved Stress Test - denies ECHO - faxed PCP for results  Cardiac Cath - none  Sleep Study - none  CPAP - no  Fasting Blood Sugar - na Checks Blood Sugar _____ times a day  Blood Thinner Instructions:start 7/3. Pt reports she started taking today. Take DOS. Aspirin Instructions: continue taking. Take DOS  ERAS Protcol -no PRE-SURGERY Ensure or G2-   COVID TEST- na   Anesthesia review: yes- pt was recent code stroke -07/11/22  Patient denies shortness of breath, fever, cough and chest pain over the phone call   Surgical Instructions    Your procedure is scheduled on Monday July 8.   Report to [facility] Main Entrance "A" at 5:30 A.M., then check in with the Admitting office.  Call this number if you have problems the morning of surgery:  [phone]    Remember:  Do not eat or drink anything after midnight the night before your surgery   Take these medicines the morning of surgery with A SIP OF WATER: Xanax,Aspirin,Prozac,Crestor,Brilinta  As of today, STOP taking any Aleve, Naproxen, Ibuprofen, Motrin, Advil, Goody's, BC's, all herbal medications, fish oil, and all vitamins.  [facility] is not responsible for any belongings or valuables. .   Do NOT Smoke (Tobacco/Vaping)  24 hours prior to your procedure  If you use a CPAP at night, you may bring your mask for your overnight stay.   Contacts, glasses, hearing aids, dentures or partials may not be worn into surgery, please bring cases for these belongings   Patients discharged the day of surgery will not be allowed to drive home,  and someone needs to stay with them for 24 hours  Special instructions:    Oral Hygiene is also important to reduce your risk of infection.  Remember - BRUSH YOUR TEETH THE MORNING OF SURGERY WITH YOUR REGULAR TOOTHPASTE   Day of Surgery:  Take a shower the day of or night before with antibacterial soap. Wear Clean/Comfortable clothing the morning of surgery Do not apply any deodorants/lotions.   Do not wear jewelry or makeup Do not wear lotions, powders, perfumes/colognes, or deodorant. Do not shave 48 hours prior to surgery.  Men may shave face and neck. Do not bring valuables to the hospital. Do not wear nail polish, gel polish, artificial nails, or any other type of covering on natural nails (fingers and toes) If you have artificial nails or gel coating that need to be removed by a nail salon, please have this removed prior to surgery. Artificial nails or gel coating may interfere with anesthesia's ability to adequately monitor your vital signs. Remember to brush your teeth WITH YOUR REGULAR TOOTHPASTE.

## 2022-08-09 ENCOUNTER — Other Ambulatory Visit: Payer: Self-pay | Admitting: Student

## 2022-08-09 NOTE — Anesthesia Preprocedure Evaluation (Signed)
Anesthesia Evaluation  Patient identified by MRN, date of birth, ID band Patient awake    Reviewed: Allergy & Precautions, NPO status , Patient's Chart, lab work & pertinent test results  Airway Mallampati: II  TM Distance: >3 FB Neck ROM: Full    Dental no notable dental hx. (+) Teeth Intact   Pulmonary asthma , Current Smoker   Pulmonary exam normal breath sounds clear to auscultation       Cardiovascular hypertension, Normal cardiovascular exam Rhythm:Regular Rate:Normal     Neuro/Psych  PSYCHIATRIC DISORDERS Anxiety Depression    CVA (right occipital infarct 06/22/15 MRI)    GI/Hepatic Neg liver ROS,,,  Endo/Other  negative endocrine ROS    Renal/GU      Musculoskeletal   Abdominal   Peds  Hematology Pt Started on Brilenta 7/3   Anesthesia Other Findings R Breast CA  Reproductive/Obstetrics                             Anesthesia Physical Anesthesia Plan  ASA: 3  Anesthesia Plan: General   Post-op Pain Management: Ofirmev IV (intra-op)* and Precedex   Induction: Intravenous  PONV Risk Score and Plan: Treatment may vary due to age or medical condition and Ondansetron  Airway Management Planned: Oral ETT  Additional Equipment: Arterial line  Intra-op Plan:   Post-operative Plan: Extubation in OR  Informed Consent:      Dental advisory given  Plan Discussed with:   Anesthesia Plan Comments: (PAT note written 08/09/2022 by Shonna Chock, PA-C.  )       Anesthesia Quick Evaluation

## 2022-08-09 NOTE — Progress Notes (Addendum)
1800 Pt called, she had vomited and had black loose stools 3x today. No abd pain. She started the blood thinner on 08/07/22.  I advised for to call Dr Corliss Skains office and ask for the on call MD, if not able to talk to the Dr, she should go to the ED. Pt verbalized understanding.

## 2022-08-09 NOTE — Progress Notes (Signed)
Anesthesia Chart Review: Kristen Knox  Case: [id number] Date/Time: 08/12/22 0730   Procedure: LICA aneurysm embolization   Anesthesia type: General   Pre-op diagnosis: Brain aneurysm   [facility] RAD[facility]OLOG[facility] OR   Surgeons: Julieanne Cotton, MD       DISCUSSION: It is a 62 year old female scheduled for the above procedure. She has been followed by IR for right ICA stenosis and left ICA/PCA aneurysm. 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 Above procedure planned.   Other history includes smoking, HTN, asthma, right breast cancer (s/p right mastectomy 2014, chemotherapy), anemia, CVA (chronic right occipital infarct 06/22/15 MRI), carotid artery disease  She is on ASA 81 mg and was started on Brilinta as well 5 days prior to procedure.   She is a same-day workup, so anesthesia team to evaluate on the day of surgery. There is no prior EKG viewable in [facility].    VS:  BP Readings from Last 3 Encounters:  07/11/22 131/73  09/12/16 109/70  08/15/15 108/73   Pulse Readings from Last 3 Encounters:  07/11/22 78  09/12/16 80  08/15/15 90     PROVIDERS: Donnel Saxon, MD is PCP at [facility]    LABS: Most recent lab results in [facility] include: Lab Results  Component Value Date   WBC 4.6 07/11/2022   HGB 13.2 07/11/2022   HCT 38.4 07/11/2022   PLT 266 07/11/2022   GLUCOSE 79 07/11/2022   NA 128 (L) 07/11/2022   K 3.7 07/11/2022   CL 94 (L) 07/11/2022   CREATININE 0.52 07/11/2022   BUN <5 (L) 07/11/2022   CO2 23 07/11/2022   INR 0.9 07/11/2022     Past Medical History:  Diagnosis Date   Anemia    Anxiety    Asthma    Cancer (HCC) 02/05/2012   Rt breast Ca   Depression    Headache    Hypertension    Stroke [facility])     Past Surgical History:  Procedure Laterality Date   BREAST SURGERY Right 02/05/2012    mastectomy   CHOLECYSTECTOMY     FRACTURE SURGERY Left 2022   wrist   IR 3D INDEPENDENT WKST  09/12/2016   IR 3D INDEPENDENT [facility]KST  07/11/2022   IR ANGIO INTRA EXTRACRAN SEL COM CAROTID INNOMINATE BILAT MOD SED  09/12/2016   IR ANGIO INTRA EXTRACRAN SEL COM CAROTID INNOMINATE BILAT MOD SED  07/11/2022   IR ANGIO VERTEBRAL SEL SUBCLAVIAN INNOMINATE UNI L MOD SED  09/12/2016   IR ANGIO VERTEBRAL SEL SUBCLAVIAN INNOMINATE UNI L MOD SED  07/11/2022   IR ANGIO VERTEBRAL SEL VERTEBRAL UNI R MOD SED  09/12/2016   IR ANGIO VERTEBRAL SEL VERTEBRAL UNI R MOD SED  07/11/2022   IR GENERIC HISTORICAL  09/05/2015   IR RADIOLOGIST EVAL & MGMT 09/05/2015 [facility]-INTERV RAD   IR RADIOLOGIST EVAL & MGMT  10/23/2016   IR RADIOLOGIST EVAL & MGMT  07/04/2022   IR RADIOLOGIST EVAL & MGMT  07/18/2022   MASTECTOMY     TUBAL LIGATION      MEDICATIONS: No current facility-administered medications for this encounter.    alprazolam (XANAX) 2 MG tablet   aspirin 81 MG chewable tablet   ergocalciferol (VITAMIN D2) 1.25 MG (50000 UT) capsule   FLUoxetine (PROZAC) 40 MG capsule   ibuprofen (ADVIL) 200 MG tablet   lisinopril (  PRINIVIL,ZESTRIL) 40 MG tablet   PROLIA 60 MG/ML SOSY injection   rosuvastatin (CRESTOR) 20 MG tablet   ticagrelor (BRILINTA) 90 MG TABS tablet    Shonna Chock, PA-C Surgical Short Stay/Anesthesiology [facility] Phone [phone] [facility] Phone [phone] 08/09/2022 3:04 PM

## 2022-08-12 ENCOUNTER — Encounter (HOSPITAL_COMMUNITY)
Admission: RE | Disposition: A | Discharge status: Home / Self Care | Payer: Self-pay | Source: Home / Self Care | Attending: Interventional Radiology

## 2022-08-12 ENCOUNTER — Inpatient Hospital Stay (HOSPITAL_COMMUNITY): Payer: 59 | Admitting: Vascular Surgery

## 2022-08-12 ENCOUNTER — Encounter (HOSPITAL_COMMUNITY): Payer: Self-pay | Admitting: Interventional Radiology

## 2022-08-12 ENCOUNTER — Inpatient Hospital Stay (HOSPITAL_COMMUNITY)
Admission: RE | Admit: 2022-08-12 | Discharge: 2022-08-12 | Disposition: A | Discharge status: Home / Self Care | Payer: 59 | Source: Ambulatory Visit | Attending: Interventional Radiology | Admitting: Interventional Radiology

## 2022-08-12 ENCOUNTER — Inpatient Hospital Stay (HOSPITAL_COMMUNITY)
Admission: RE | Admit: 2022-08-12 | Discharge: 2022-08-14 | DRG: 027 | Disposition: A | Discharge status: Home / Self Care | Payer: 59 | Attending: Interventional Radiology | Admitting: Interventional Radiology

## 2022-08-12 DIAGNOSIS — R195 Other fecal abnormalities: Secondary | ICD-10-CM | POA: Diagnosis not present

## 2022-08-12 DIAGNOSIS — I671 Cerebral aneurysm, nonruptured: Secondary | ICD-10-CM

## 2022-08-12 DIAGNOSIS — M7981 Nontraumatic hematoma of soft tissue: Secondary | ICD-10-CM | POA: Diagnosis not present

## 2022-08-12 DIAGNOSIS — I959 Hypotension, unspecified: Secondary | ICD-10-CM | POA: Diagnosis not present

## 2022-08-12 DIAGNOSIS — D649 Anemia, unspecified: Secondary | ICD-10-CM | POA: Diagnosis not present

## 2022-08-12 DIAGNOSIS — Z9011 Acquired absence of right breast and nipple: Secondary | ICD-10-CM | POA: Diagnosis not present

## 2022-08-12 DIAGNOSIS — F17210 Nicotine dependence, cigarettes, uncomplicated: Secondary | ICD-10-CM | POA: Diagnosis present

## 2022-08-12 DIAGNOSIS — Z7982 Long term (current) use of aspirin: Secondary | ICD-10-CM | POA: Diagnosis not present

## 2022-08-12 DIAGNOSIS — Z853 Personal history of malignant neoplasm of breast: Secondary | ICD-10-CM | POA: Diagnosis not present

## 2022-08-12 DIAGNOSIS — J45909 Unspecified asthma, uncomplicated: Secondary | ICD-10-CM | POA: Diagnosis present

## 2022-08-12 DIAGNOSIS — I6521 Occlusion and stenosis of right carotid artery: Secondary | ICD-10-CM | POA: Diagnosis present

## 2022-08-12 DIAGNOSIS — I10 Essential (primary) hypertension: Secondary | ICD-10-CM | POA: Diagnosis present

## 2022-08-12 DIAGNOSIS — Z79899 Other long term (current) drug therapy: Secondary | ICD-10-CM | POA: Diagnosis not present

## 2022-08-12 DIAGNOSIS — Z7902 Long term (current) use of antithrombotics/antiplatelets: Secondary | ICD-10-CM

## 2022-08-12 DIAGNOSIS — I724 Aneurysm of artery of lower extremity: Secondary | ICD-10-CM | POA: Diagnosis not present

## 2022-08-12 DIAGNOSIS — Z419 Encounter for procedure for purposes other than remedying health state, unspecified: Secondary | ICD-10-CM

## 2022-08-12 DIAGNOSIS — F32A Depression, unspecified: Secondary | ICD-10-CM | POA: Diagnosis present

## 2022-08-12 DIAGNOSIS — F419 Anxiety disorder, unspecified: Secondary | ICD-10-CM | POA: Diagnosis present

## 2022-08-12 DIAGNOSIS — Z8673 Personal history of transient ischemic attack (TIA), and cerebral infarction without residual deficits: Secondary | ICD-10-CM

## 2022-08-12 DIAGNOSIS — Z9889 Other specified postprocedural states: Secondary | ICD-10-CM

## 2022-08-12 HISTORY — PX: RADIOLOGY WITH ANESTHESIA: SHX6223

## 2022-08-12 HISTORY — PX: IR TRANSCATH/EMBOLIZ: IMG695

## 2022-08-12 HISTORY — DX: Anemia, unspecified: D64.9

## 2022-08-12 HISTORY — PX: IR CT HEAD LTD: IMG2386

## 2022-08-12 HISTORY — PX: IR 3D INDEPENDENT WKST: IMG2385

## 2022-08-12 LAB — CBC WITH DIFFERENTIAL/PLATELET
Abs Immature Granulocytes: 0.02 10*3/uL (ref 0.00–0.07)
Basophils Absolute: 0 10*3/uL (ref 0.0–0.1)
Basophils Relative: 1 %
Eosinophils Absolute: 0.1 10*3/uL (ref 0.0–0.5)
Eosinophils Relative: 2 %
HCT: 34.3 % — ABNORMAL LOW (ref 36.0–46.0)
Hemoglobin: 11.3 g/dL — ABNORMAL LOW (ref 12.0–15.0)
Immature Granulocytes: 0 %
Lymphocytes Relative: 19 %
Lymphs Abs: 0.9 10*3/uL (ref 0.7–4.0)
MCH: 30.5 pg (ref 26.0–34.0)
MCHC: 32.9 g/dL (ref 30.0–36.0)
MCV: 92.7 fL (ref 80.0–100.0)
Monocytes Absolute: 0.4 10*3/uL (ref 0.1–1.0)
Monocytes Relative: 9 %
Neutro Abs: 3.3 10*3/uL (ref 1.7–7.7)
Neutrophils Relative %: 69 %
Platelets: 281 10*3/uL (ref 150–400)
RBC: 3.7 MIL/uL — ABNORMAL LOW (ref 3.87–5.11)
RDW: 13.3 % (ref 11.5–15.5)
WBC: 4.8 10*3/uL (ref 4.0–10.5)
nRBC: 0 % (ref 0.0–0.2)

## 2022-08-12 LAB — COMPREHENSIVE METABOLIC PANEL
ALT: 23 U/L (ref 0–44)
AST: 40 U/L (ref 15–41)
Albumin: 4 g/dL (ref 3.5–5.0)
Alkaline Phosphatase: 42 U/L (ref 38–126)
Anion gap: 12 (ref 5–15)
BUN: <5 mg/dL — ABNORMAL LOW (ref 8–23)
CO2: 18 mmol/L — ABNORMAL LOW (ref 22–32)
Calcium: 8.9 mg/dL (ref 8.9–10.3)
Chloride: 99 mmol/L (ref 98–111)
Creatinine, Ser: 0.47 mg/dL (ref 0.44–1.00)
GFR, Estimated: >60 mL/min (ref >60)
Glucose, Bld: 86 mg/dL (ref 70–99)
Potassium: 3.8 mmol/L (ref 3.5–5.1)
Sodium: 129 mmol/L — ABNORMAL LOW (ref 135–145)
Total Bilirubin: 0.6 mg/dL (ref 0.3–1.2)
Total Protein: 6.9 g/dL (ref 6.5–8.1)

## 2022-08-12 LAB — PROTIME-INR
INR: 0.9 (ref 0.8–1.2)
Prothrombin Time: 12 seconds (ref 11.4–15.2)

## 2022-08-12 LAB — HEPARIN LEVEL (UNFRACTIONATED): Heparin Unfractionated: 0.18 IU/mL — ABNORMAL LOW (ref 0.30–0.70)

## 2022-08-12 LAB — MRSA NEXT GEN BY PCR, NASAL: MRSA by PCR Next Gen: NOT DETECTED

## 2022-08-12 SURGERY — IR WITH ANESTHESIA
Anesthesia: General

## 2022-08-12 MED ORDER — ALPRAZOLAM 0.5 MG PO TABS
1.0000 mg | ORAL_TABLET | Freq: Four times a day (QID) | ORAL | Status: DC | PRN
  Administered 2022-08-12 – 2022-08-14 (×4): 1 mg via ORAL
  Filled 2022-08-12 (×4): qty 2

## 2022-08-12 MED ORDER — CLEVIDIPINE BUTYRATE 0.5 MG/ML IV EMUL
INTRAVENOUS | Status: DC | PRN
  Administered 2022-08-12: 5 mg/h via INTRAVENOUS

## 2022-08-12 MED ORDER — EPTIFIBATIDE 20 MG/10ML IV SOLN
INTRAVENOUS | Status: AC | PRN
  Administered 2022-08-12: 3 mg via INTRAVENOUS

## 2022-08-12 MED ORDER — ACETAMINOPHEN 10 MG/ML IV SOLN
INTRAVENOUS | Status: DC | PRN
  Administered 2022-08-12: 1000 mg via INTRAVENOUS

## 2022-08-12 MED ORDER — NIMODIPINE 30 MG PO CAPS
0.0000 mg | ORAL_CAPSULE | ORAL | Status: DC
  Filled 2022-08-12 (×2): qty 2

## 2022-08-12 MED ORDER — DEXAMETHASONE SODIUM PHOSPHATE 10 MG/ML IJ SOLN
INTRAMUSCULAR | Status: DC | PRN
  Administered 2022-08-12: 10 mg via INTRAVENOUS

## 2022-08-12 MED ORDER — PHENYLEPHRINE 80 MCG/ML (10ML) SYRINGE FOR IV PUSH (FOR BLOOD PRESSURE SUPPORT)
PREFILLED_SYRINGE | INTRAVENOUS | Status: DC | PRN
  Administered 2022-08-12 (×4): 80 ug via INTRAVENOUS

## 2022-08-12 MED ORDER — ESMOLOL HCL 100 MG/10ML IV SOLN: INTRAVENOUS | Status: DC | PRN

## 2022-08-12 MED ORDER — ORAL CARE MOUTH RINSE: 15.0000 mL | Freq: Once | OROMUCOSAL | Status: AC

## 2022-08-12 MED ORDER — ASPIRIN 81 MG PO CHEW
81.0000 mg | CHEWABLE_TABLET | Freq: Every day | ORAL | Status: DC
  Administered 2022-08-12 – 2022-08-14 (×3): 81 mg via ORAL
  Filled 2022-08-12 (×3): qty 1

## 2022-08-12 MED ORDER — FENTANYL CITRATE (PF) 250 MCG/5ML IJ SOLN
INTRAMUSCULAR | Status: DC | PRN
  Administered 2022-08-12 (×2): 50 ug via INTRAVENOUS

## 2022-08-12 MED ORDER — HEPARIN (PORCINE) 25000 UT/250ML-% IV SOLN
INTRAVENOUS | Status: AC
  Filled 2022-08-12: qty 250

## 2022-08-12 MED ORDER — CHLORHEXIDINE GLUCONATE CLOTH 2 % EX PADS
6.0000 | MEDICATED_PAD | Freq: Every day | CUTANEOUS | Status: DC
  Administered 2022-08-12 – 2022-08-13 (×2): 6 via TOPICAL

## 2022-08-12 MED ORDER — ACETAMINOPHEN 650 MG RE SUPP: 650.0000 mg | RECTAL | Status: DC | PRN

## 2022-08-12 MED ORDER — HEPARIN (PORCINE) 25000 UT/250ML-% IV SOLN: 500.0000 [IU]/h | INTRAVENOUS | Status: DC

## 2022-08-12 MED ORDER — IOHEXOL 300 MG/ML  SOLN
150.0000 mL | Freq: Once | INTRAMUSCULAR | Status: AC | PRN
  Administered 2022-08-12: 65 mL via INTRA_ARTERIAL

## 2022-08-12 MED ORDER — ONDANSETRON HCL 4 MG/2ML IJ SOLN
INTRAMUSCULAR | Status: DC | PRN
  Administered 2022-08-12: 4 mg via INTRAVENOUS

## 2022-08-12 MED ORDER — SODIUM CHLORIDE 0.9 % IV SOLN: INTRAVENOUS | Status: DC

## 2022-08-12 MED ORDER — SODIUM CHLORIDE 0.9 % IV BOLUS
250.0000 mL | INTRAVENOUS | Status: AC | PRN
  Administered 2022-08-12: 250 mL via INTRAVENOUS

## 2022-08-12 MED ORDER — IOHEXOL 300 MG/ML  SOLN
150.0000 mL | Freq: Once | INTRAMUSCULAR | Status: AC | PRN
  Administered 2022-08-12: 100 mL via INTRA_ARTERIAL

## 2022-08-12 MED ORDER — HEPARIN SODIUM (PORCINE) 1000 UNIT/ML IJ SOLN
INTRAMUSCULAR | Status: DC | PRN
  Administered 2022-08-12: 3000 [IU] via INTRAVENOUS

## 2022-08-12 MED ORDER — NICOTINE 14 MG/24HR TD PT24
14.0000 mg | MEDICATED_PATCH | Freq: Every day | TRANSDERMAL | Status: DC
  Administered 2022-08-12 – 2022-08-14 (×3): 14 mg via TRANSDERMAL
  Filled 2022-08-12 (×3): qty 1

## 2022-08-12 MED ORDER — EPTIFIBATIDE 20 MG/10ML IV SOLN
INTRAVENOUS | Status: AC
  Filled 2022-08-12: qty 10

## 2022-08-12 MED ORDER — SODIUM CHLORIDE 0.9 % IV SOLN
0.1500 ug/kg/min | INTRAVENOUS | Status: AC
  Administered 2022-08-12: .15 ug/kg/min via INTRAVENOUS
  Filled 2022-08-12: qty 2000

## 2022-08-12 MED ORDER — FLUOXETINE HCL 20 MG PO CAPS: 20.0000 mg | ORAL_CAPSULE | Freq: Every day | ORAL | Status: DC

## 2022-08-12 MED ORDER — TICAGRELOR 60 MG PO TABS
60.0000 mg | ORAL_TABLET | Freq: Two times a day (BID) | ORAL | Status: DC
  Administered 2022-08-12 – 2022-08-14 (×4): 60 mg via ORAL
  Filled 2022-08-12 (×6): qty 1

## 2022-08-12 MED ORDER — LIDOCAINE HCL 1 % IJ SOLN
INTRAMUSCULAR | Status: AC
  Filled 2022-08-12: qty 20

## 2022-08-12 MED ORDER — PROTAMINE SULFATE 10 MG/ML IV SOLN
INTRAVENOUS | Status: DC | PRN
  Administered 2022-08-12 (×2): 5 mg via INTRAVENOUS

## 2022-08-12 MED ORDER — EPHEDRINE SULFATE-NACL 50-0.9 MG/10ML-% IV SOSY
PREFILLED_SYRINGE | INTRAVENOUS | Status: DC | PRN
  Administered 2022-08-12 (×4): 5 mg via INTRAVENOUS

## 2022-08-12 MED ORDER — ACETAMINOPHEN 325 MG PO TABS: 650.0000 mg | ORAL_TABLET | ORAL | Status: DC | PRN

## 2022-08-12 MED ORDER — CEFAZOLIN SODIUM-DEXTROSE 2-4 GM/100ML-% IV SOLN
2.0000 g | INTRAVENOUS | Status: AC
  Administered 2022-08-12: 2 g via INTRAVENOUS
  Filled 2022-08-12 (×2): qty 100

## 2022-08-12 MED ORDER — ESMOLOL HCL 100 MG/10ML IV SOLN
INTRAVENOUS | Status: DC | PRN
  Administered 2022-08-12: 40 mg via INTRAVENOUS
  Administered 2022-08-12 (×2): 20 mg via INTRAVENOUS

## 2022-08-12 MED ORDER — PROPOFOL 10 MG/ML IV BOLUS
INTRAVENOUS | Status: DC | PRN
  Administered 2022-08-12: 50 mg via INTRAVENOUS
  Administered 2022-08-12: 20 mg via INTRAVENOUS
  Administered 2022-08-12: 30 mg via INTRAVENOUS
  Administered 2022-08-12: 100 mg via INTRAVENOUS

## 2022-08-12 MED ORDER — LACTATED RINGERS IV SOLN: INTRAVENOUS | Status: DC

## 2022-08-12 MED ORDER — CHLORHEXIDINE GLUCONATE 0.12 % MT SOLN
15.0000 mL | Freq: Once | OROMUCOSAL | Status: AC
  Administered 2022-08-12: 15 mL via OROMUCOSAL
  Filled 2022-08-12: qty 15

## 2022-08-12 MED ORDER — FLUOXETINE HCL 20 MG PO CAPS
20.0000 mg | ORAL_CAPSULE | Freq: Every day | ORAL | Status: DC
  Administered 2022-08-12 – 2022-08-14 (×3): 20 mg via ORAL
  Filled 2022-08-12 (×3): qty 1

## 2022-08-12 MED ORDER — CLEVIDIPINE BUTYRATE 0.5 MG/ML IV EMUL: 0.0000 mg/h | INTRAVENOUS | Status: AC

## 2022-08-12 MED ORDER — PHENYLEPHRINE HCL-NACL 20-0.9 MG/250ML-% IV SOLN
INTRAVENOUS | Status: DC | PRN
  Administered 2022-08-12: 20 ug/min via INTRAVENOUS

## 2022-08-12 MED ORDER — DEXMEDETOMIDINE HCL IN NACL 80 MCG/20ML IV SOLN
INTRAVENOUS | Status: DC | PRN
  Administered 2022-08-12: 4 ug via INTRAVENOUS
  Administered 2022-08-12: 8 ug via INTRAVENOUS
  Administered 2022-08-12: 4 ug via INTRAVENOUS

## 2022-08-12 MED ORDER — SODIUM CHLORIDE 0.9 % IV BOLUS
250.0000 mL | Freq: Once | INTRAVENOUS | Status: AC
  Administered 2022-08-12: 250 mL via INTRAVENOUS

## 2022-08-12 MED ORDER — ACETAMINOPHEN 160 MG/5ML PO SOLN: 650.0000 mg | ORAL | Status: DC | PRN

## 2022-08-12 MED ORDER — HEPARIN (PORCINE) 25000 UT/250ML-% IV SOLN
500.0000 [IU]/h | INTRAVENOUS | Status: DC
  Administered 2022-08-12: 500 [IU]/h via INTRAVENOUS
  Filled 2022-08-12: qty 250

## 2022-08-12 MED ORDER — ROCURONIUM BROMIDE 10 MG/ML (PF) SYRINGE
PREFILLED_SYRINGE | INTRAVENOUS | Status: DC | PRN
  Administered 2022-08-12: 50 mg via INTRAVENOUS

## 2022-08-12 MED ORDER — ASPIRIN 81 MG PO CHEW: 81.0000 mg | CHEWABLE_TABLET | Freq: Every day | ORAL | Status: DC

## 2022-08-12 MED ORDER — LIDOCAINE 2% (20 MG/ML) 5 ML SYRINGE
INTRAMUSCULAR | Status: DC | PRN
  Administered 2022-08-12: 40 mg via INTRAVENOUS

## 2022-08-12 MED ORDER — TICAGRELOR 90 MG PO TABS: 90.0000 mg | ORAL_TABLET | Freq: Two times a day (BID) | ORAL | Status: DC

## 2022-08-12 NOTE — Sedation Documentation (Signed)
ACT 244.

## 2022-08-12 NOTE — Anesthesia Procedure Notes (Signed)
Procedure Name: Intubation Date/Time: 08/12/2022 8:45 AM  Performed by: Darryl Nestle, CRNAPre-anesthesia Checklist: Patient identified, Emergency Drugs available, Suction available and Patient being monitored Patient Re-evaluated:Patient Re-evaluated prior to induction Oxygen Delivery Method: Circle system utilized Preoxygenation: Pre-oxygenation with 100% oxygen Induction Type: IV induction Ventilation: Mask ventilation without difficulty Laryngoscope Size: Mac and 3 Grade View: Grade I Tube type: Oral Tube size: 7.0 mm Number of attempts: 1 Airway Equipment and Method: Stylet and Oral airway Placement Confirmation: ETT inserted through vocal cords under direct vision, positive ETCO2 and breath sounds checked- equal and bilateral Secured at: 21 cm Tube secured with: Tape Dental Injury: Teeth and Oropharynx as per pre-operative assessment

## 2022-08-12 NOTE — Procedures (Signed)
INR.  Status post left common carotid arteriogram.  Right CFA approach.  Findings.  Wide neck approximately a 4.3 mm x 4.3 mm left posterior communicating artery aneurysm.  Status post embolization with a 3.5 mm x 14 mm pipeline flex vantage flow diverter.  Post CT of the brain no evidence of intracranial hemorrhage.  Manual  compression held at the right groin puncture site for 25 minutes with quick clot..  Distal pulses all palpable in both feet unchanged.  Patient extubated.  Follows simple commands appropriately.  Denies any headaches nausea vomiting.  Pupils 3 mm bilaterally sluggishly reactive.  No gross facial asymmetry.  Tongue midline.  Moves all fours spontaneously command.  Fatima Sanger MD

## 2022-08-12 NOTE — TOC Initial Note (Signed)
Transition of Care [facility]) - Initial/Assessment Note    Patient Details  Name: Kristen Knox MRN: [id number] Date of Birth: 31-May-1960  Transition of Care [facility]) CM/SW Contact:    Mearl Latin, LCSW Phone Number: 08/12/2022, 5:09 PM  Clinical Narrative:                 Patient admitted from home. No TOC needs identified at this time. Please consult TOC if needs arise.     Barriers to Discharge: Continued Medical Work up   Patient Goals and CMS Choice            Expected Discharge Plan and Services                                              Prior Living Arrangements/Services   Lives with:: Self Patient language and need for interpreter reviewed:: Yes        Need for Family Participation in Patient Care: Yes (Comment) Care giver support system in place?: Yes (comment)   Criminal Activity/Legal Involvement Pertinent to Current Situation/Hospitalization: No - Comment as needed  Activities of Daily Living Home Assistive Devices/Equipment: Grab bars in shower, Eyeglasses ADL Screening (condition at time of admission) Patient's cognitive ability adequate to safely complete daily activities?: Yes Is the patient deaf or have difficulty hearing?: No Does the patient have difficulty seeing, even when wearing glasses/contacts?: No Does the patient have difficulty concentrating, remembering, or making decisions?: No Patient able to express need for assistance with ADLs?: Yes Does the patient have difficulty dressing or bathing?: No Independently performs ADLs?: Yes (appropriate for developmental age) Does the patient have difficulty walking or climbing stairs?: Yes Weakness of Legs: None Weakness of Arms/Hands: None  Permission Sought/Granted                  Emotional Assessment Appearance:: Appears stated age     Orientation: : Oriented to Self, Oriented to Place, Oriented to  Time, Oriented to Situation Alcohol / Substance Use: Not Applicable Psych  Involvement: No (comment)  Admission diagnosis:  Status post coil embolization of cerebral aneurysm [Z98.890] Patient Active Problem List   Diagnosis Date Noted   Status post coil embolization of cerebral aneurysm 08/12/2022   Cancer (HCC)    PCP:  Pcp, No Pharmacy:   [facility] - [place], [place] - [address] [place] [address] Phone: [phone] Fax: [phone]     Social Determinants of Health (SDOH) Social History: SDOH Screenings   Food Insecurity: No Food Insecurity (08/12/2022)  Housing: Low Risk  (08/12/2022)  Transportation Needs: No Transportation Needs (08/12/2022)  Tobacco Use: High Risk (08/12/2022)   SDOH Interventions:     Readmission Risk Interventions     No data to display

## 2022-08-12 NOTE — Progress Notes (Signed)
Orthopedic Tech Progress Note Patient Details:  CLYDE KOTTLER [birth year] [id number]  IR RN called requesting a KNEE IMMOBILIZER for patient  Ortho Devices Type of Ortho Device: Knee Immobilizer Ortho Device/Splint Location: RLE Ortho Device/Splint Interventions: Ordered, Other (comment)   Post Interventions Patient Tolerated: Other (comment) Instructions Provided: Other (comment)  Donald Pore 08/12/2022, 12:20 PM

## 2022-08-12 NOTE — Transfer of Care (Signed)
Immediate Anesthesia Transfer of Care Note  Patient: Kristen Knox  Procedure(s) Performed: LICA aneurysm embolization  Patient Location: PACU  Anesthesia Type:General  Level of Consciousness: awake, alert , and oriented  Airway & Oxygen Therapy: Patient Spontanous Breathing and Patient connected to nasal cannula oxygen  Post-op Assessment: Report given to RN, Post -op Vital signs reviewed and stable, and Patient moving all extremities X 4  Post vital signs: Reviewed and stable  Last Vitals:  Vitals Value Taken Time  BP 128/118 08/12/22 1232  Temp 98   Pulse 108 08/12/22 1235  Resp 11 08/12/22 1235  SpO2 96 % 08/12/22 1235  Vitals shown include unvalidated device data.  Last Pain:  Vitals:   08/12/22 0701  TempSrc:   PainSc: 0-No pain         Complications: No notable events documented.

## 2022-08-12 NOTE — Sedation Documentation (Signed)
Paitient on table with aneshtesia

## 2022-08-12 NOTE — Anesthesia Procedure Notes (Signed)
Arterial Line Insertion Start/End7/09/2022 7:10 AM, 08/12/2022 7:20 AM Performed by: Darryl Nestle, CRNA, CRNA  Patient location: Pre-op. Preanesthetic checklist: patient identified, IV checked, site marked, risks and benefits discussed, surgical consent, monitors and equipment checked, pre-op evaluation, timeout performed and anesthesia consent Lidocaine 1% used for infiltration Left, radial was placed Catheter size: 20 G Hand hygiene performed  and maximum sterile barriers used   Attempts: 3 Procedure performed using ultrasound guided technique. Following insertion, dressing applied and Biopatch. Post procedure assessment: normal and unchanged  Post procedure complications: local hematoma and unsuccessful attempts. Patient tolerated the procedure well with no immediate complications.

## 2022-08-12 NOTE — Progress Notes (Signed)
ANTICOAGULATION CONSULT NOTE  Pharmacy Consult for heparin Indication: Post-IR  No Known Allergies  Patient Measurements: Height: 5\' 2"  (157.5 cm) Weight: 49 kg (108 lb) IBW/kg (Calculated) : 50.1 Heparin Dosing Weight: TBW  Vital Signs: Temp: 98.1 F (36.7 C) (07/08 2000) Temp Source: Oral (07/08 2000) BP: 106/67 (07/08 2100) Pulse Rate: 88 (07/08 2100)  Labs: Recent Labs    08/12/22 0655 08/12/22 0707 08/12/22 2102  HGB  --  11.3*  --   HCT  --  34.3*  --   PLT  --  281  --   LABPROT 12.0  --   --   INR 0.9  --   --   HEPARINUNFRC  --   --  0.18*  CREATININE 0.47  --   --      Estimated Creatinine Clearance: 55.7 mL/min (by C-G formula based on SCr of 0.47 mg/dL).    Assessment: 61 YOF presenting post-IR for carotid arteriogram, transfer from IR on heparin 500 units/hr.  Heparin level 0.18 (therapeutic) on infusion at 500 units/hr. No bleeding noted.  Goal of Therapy:  Heparin level 0.1-0.25 units/ml Monitor platelets by anticoagulation protocol: Yes   Plan:  Continue heparin gtt at 500 units/hr Heparin to be off at 0800 tomorrow  Christoper Fabian, PharmD, BCPS Please see amion for complete clinical pharmacist phone list 08/12/2022 9:52 PM

## 2022-08-12 NOTE — Progress Notes (Signed)
ANTICOAGULATION CONSULT NOTE - Initial Consult  Pharmacy Consult for heparin Indication: Post-IR  No Known Allergies  Patient Measurements: Height: 5\' 2"  (157.5 cm) Weight: 49 kg (108 lb) IBW/kg (Calculated) : 50.1 Heparin Dosing Weight: TBW  Vital Signs: Temp: 96.1 F (35.6 C) (07/08 1323) Temp Source: Axillary (07/08 1323) BP: 136/96 (07/08 1323) Pulse Rate: 92 (07/08 1323)  Labs: Recent Labs    08/12/22 0655 08/12/22 0707  HGB  --  11.3*  HCT  --  34.3*  PLT  --  281  LABPROT 12.0  --   INR 0.9  --   CREATININE 0.47  --     Estimated Creatinine Clearance: 55.7 mL/min (by C-G formula based on SCr of 0.47 mg/dL).   Medical History: Past Medical History:  Diagnosis Date   Anemia    Anxiety    Asthma    Cancer (HCC) 02/05/2012   Rt breast Ca   Depression    Headache    Hypertension    Stroke [facility])    Thyroid nodule 2020    Assessment: 61 YOF presenting post-IR for carotid arteriogram, transfer from IR on heparin 500 units/hr  Goal of Therapy:  Heparin level 0.1-0.25 units/ml Monitor platelets by anticoagulation protocol: Yes   Plan:  Continue heparin gtt at 500 units/hr F/u 6 hour heparin level   Daylene Posey, PharmD, [facility] Clinical Pharmacist ED Pharmacist Phone # [phone] 08/12/2022 1:35 PM

## 2022-08-12 NOTE — Sedation Documentation (Signed)
ACT 214

## 2022-08-12 NOTE — Sedation Documentation (Signed)
QUICK CLOT TO GROIN,gauze +tegaderm applied, leg immobilizer applied to right leg

## 2022-08-12 NOTE — Progress Notes (Signed)
Patient seen on 4N, sleeping soundly - arouses to loud verbal and tactile cues. Warming blanket in place due to patient being cold. Foley in place draining clear yellow urine. Right knee immobilizer in place. Heparin gtt infusion, cleviprex off due to hypotension  Ms. Frazee denies headache, vision changes, n/v, dizziness. She was able to tolerate liquid earlier without a problem, looking forward to dinner later. Confirms black stools on Friday that lightened on Saturday and Sunday, no BM today, denies bright red blood.   Alert, awake, and oriented x 3 Speech and comprehension in tact PERRL bilaterally EOMs without nystagmus or subjective diplopia. Visual fields grossly in tact No facial asymmetry. Tongue midline  Motor power full BUE Negative pronator drift. Fine motor and coordination in tact BUE Right groin puncture site clean, dry, dressed appropriately. No bleeding. Mildly tender to palpation.  Plan: - May sit up at 6 pm - Continue heparin gtt - Hold home anti-hypertensives due to hypotension - Home Prozac 40 mg every day and xanax 1 mg QID PRN ordered (per patient she takes 1/2 of a 2 mg xanax 4 times per day instead of 2 mg twice per day at home which is what is on her home medication list) - Likely d/c tomorrow AM if stable - call Dr. Corliss Skains with overnight concerns  Lynnette Caffey, PA-C

## 2022-08-12 NOTE — Progress Notes (Signed)
   08/12/22 1500  Vitals  BP (!) 84/62  MAP (mmHg) 70  Pulse Rate 67  ECG Heart Rate 67  Resp 13  Oxygen Therapy  SpO2 98 %  Art Line  Arterial Line BP 99/52  Arterial Line MAP (mmHg) 70 mmHg  MEWS Score  MEWS Temp 1  MEWS Systolic 1  MEWS Pulse 0  MEWS RR 1  MEWS LOC 0  MEWS Score 3  MEWS Score Color Yellow  Provider Notification  Provider Name/Title T. Corliss Skains, MD  Date Provider Notified 08/12/22  Time Provider Notified 1500  Method of Notification Page  Notification Reason Other (Comment) (per MD request, start heparin, notify of low BP)  Provider response See new orders  Date of Provider Response 08/12/22  Time of Provider Response 1502

## 2022-08-12 NOTE — Sedation Documentation (Signed)
ACT = 226

## 2022-08-12 NOTE — Progress Notes (Signed)
Patient ID: Kristen Knox, female   DOB: 07-20-1960, 62 y.o.   MRN: [id number]  Patient seen and examined.  Chart reviewed.  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  Clinically the patient reports no new neurological changes particularly of visual changes on the left side, or speech difficulties, right-sided weakness numbness tingling or gait instability.  She denies any symptoms of chest pain shortness of breath, breathing difficulties, abdominal pain,constipation ,diarrhea melena.  Denies recent chills fever rigors.  On examination patient alert awake oriented without any focal neurological changes.  Procedure, including risks,benefits and alternatives were again reviewed with the patient.  Risk of thromboembolic stroke, and the remote possibility of intraprocedural rupture with potential for fatality also reviewed.  Patient also aware that the procedure would be terminated if  the potential risk outweighed the benefit during treatment..  Questions were answered to her satisfaction.   Informed witness consent was obtained.  Fatima Sanger MD.

## 2022-08-13 ENCOUNTER — Encounter (HOSPITAL_COMMUNITY): Payer: Self-pay | Admitting: Interventional Radiology

## 2022-08-13 ENCOUNTER — Inpatient Hospital Stay (HOSPITAL_COMMUNITY): Payer: 59

## 2022-08-13 DIAGNOSIS — I724 Aneurysm of artery of lower extremity: Secondary | ICD-10-CM | POA: Diagnosis not present

## 2022-08-13 LAB — BASIC METABOLIC PANEL
Anion gap: 11 (ref 5–15)
BUN: 5 mg/dL — ABNORMAL LOW (ref 8–23)
CO2: 19 mmol/L — ABNORMAL LOW (ref 22–32)
Calcium: 7.7 mg/dL — ABNORMAL LOW (ref 8.9–10.3)
Chloride: 107 mmol/L (ref 98–111)
Creatinine, Ser: 0.41 mg/dL — ABNORMAL LOW (ref 0.44–1.00)
GFR, Estimated: >60 mL/min (ref >60)
Glucose, Bld: 118 mg/dL — ABNORMAL HIGH (ref 70–99)
Potassium: 3.5 mmol/L (ref 3.5–5.1)
Sodium: 137 mmol/L (ref 135–145)

## 2022-08-13 LAB — CBC WITH DIFFERENTIAL/PLATELET
Abs Immature Granulocytes: 0.03 10*3/uL (ref 0.00–0.07)
Basophils Absolute: 0 10*3/uL (ref 0.0–0.1)
Basophils Relative: 0 %
Eosinophils Absolute: 0 10*3/uL (ref 0.0–0.5)
Eosinophils Relative: 0 %
HCT: 25.1 % — ABNORMAL LOW (ref 36.0–46.0)
Hemoglobin: 8.5 g/dL — ABNORMAL LOW (ref 12.0–15.0)
Immature Granulocytes: 0 %
Lymphocytes Relative: 11 %
Lymphs Abs: 0.8 10*3/uL (ref 0.7–4.0)
MCH: 32 pg (ref 26.0–34.0)
MCHC: 33.9 g/dL (ref 30.0–36.0)
MCV: 94.4 fL (ref 80.0–100.0)
Monocytes Absolute: 0.6 10*3/uL (ref 0.1–1.0)
Monocytes Relative: 8 %
Neutro Abs: 5.9 10*3/uL (ref 1.7–7.7)
Neutrophils Relative %: 81 %
Platelets: 221 10*3/uL (ref 150–400)
RBC: 2.66 MIL/uL — ABNORMAL LOW (ref 3.87–5.11)
RDW: 13.8 % (ref 11.5–15.5)
WBC: 7.4 10*3/uL (ref 4.0–10.5)
nRBC: 0 % (ref 0.0–0.2)

## 2022-08-13 LAB — POCT ACTIVATED CLOTTING TIME
Activated Clotting Time: 214 seconds
Activated Clotting Time: 226 seconds
Activated Clotting Time: 244 seconds

## 2022-08-13 MED ORDER — LISINOPRIL 20 MG PO TABS
40.0000 mg | ORAL_TABLET | Freq: Every day | ORAL | Status: DC
  Administered 2022-08-13 – 2022-08-14 (×2): 40 mg via ORAL
  Filled 2022-08-13 (×2): qty 2

## 2022-08-13 MED ORDER — DOCUSATE SODIUM 100 MG PO CAPS
100.0000 mg | ORAL_CAPSULE | Freq: Two times a day (BID) | ORAL | Status: DC | PRN
  Administered 2022-08-13 – 2022-08-14 (×2): 100 mg via ORAL
  Filled 2022-08-13 (×2): qty 1

## 2022-08-13 NOTE — Progress Notes (Cosign Needed Addendum)
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 Corliss Skains on 08/12/22.   Subjective:  Patient seen with Dr. Corliss Skains this morning.  She is laying in bed, NAD, RN at bedside.  Currently has no complaints, denies HA/vision changes/ focal neuro deficits.   Allergies: Patient has no known allergies.  Medications: Prior to Admission medications   Medication Sig Start Date End Date Taking? Authorizing Provider  alprazolam Prudy Feeler) 2 MG tablet Take 2 mg by mouth 2 (two) times daily.   Yes [provider]  aspirin 81 MG chewable tablet Chew 81 mg by mouth daily.   Yes [provider]  ergocalciferol (VITAMIN D2) 1.25 MG (50000 UT) capsule Take 50,000 Units by mouth once a week. 07/19/20  Yes [provider]  FLUoxetine (PROZAC) 40 MG capsule Take 40 mg by mouth daily.   Yes [provider]  ibuprofen (ADVIL) 200 MG tablet Take 400 mg by mouth every 6 (six) hours as needed for headache, mild pain or moderate pain (Back pain).   Yes [provider]  lisinopril (PRINIVIL,ZESTRIL) 40 MG tablet Take 40 mg by mouth daily.   Yes [provider]  PROLIA 60 MG/ML SOSY injection Inject 60 mg into the skin every 6 (six) months. 04/24/22  Yes [provider]  rosuvastatin (CRESTOR) 20 MG tablet Take 20 mg by mouth daily. 05/25/21  Yes [provider]  ticagrelor (BRILINTA) 90 MG TABS tablet Take 1 tablet (90 mg total) by mouth 2 (two) times daily. 07/25/22  Yes Gershon Crane, PA-C     Vital Signs: BP 123/64 (BP Location: Left Arm)   Pulse 79   Temp 98.3 F (36.8 C) (Oral)   Resp 18   Ht 5\' 2"  (1.575 m)   Wt 108 lb (49 kg)   SpO2 97%   BMI 19.75 kg/m   Physical Exam Vitals reviewed.  Constitutional:       General: She is not in acute distress.    Appearance: She is not ill-appearing.  HENT:     Head: Normocephalic.  Cardiovascular:     Rate and Rhythm: Normal rate and regular rhythm.     Heart sounds: Normal heart sounds.  Pulmonary:     Effort: Pulmonary effort is normal.     Breath sounds: Normal breath sounds.  Abdominal:     General: Abdomen is flat. Bowel sounds are normal.     Palpations: Abdomen is soft.  Skin:    General: Skin is warm and dry.     Coloration: Skin is not jaundiced or pale.     Comments: Positive dressing on R CFA puncture site. Site with ecchymosis at 0-3 o' clock, tenderness around 5 o'clock. No edema, active bleeding or drainage. Minimal amount of old, dry blood noted on the dressing. Dressing otherwise clean, dry, and intact. R DP 2+    Neurological:     Mental Status: She is alert and oriented to person, place, and time.     Comments: Alert, awake, and oriented  Speech and comprehension intact No facial asymmetry. Tongue midline  Motor power 5/5 all 4 No pronator drift. Fine motor and coordination intact    Psychiatric:        Mood and Affect: Mood normal.  Behavior: Behavior normal.        Judgment: Judgment normal.     Imaging: VAS [place] GROIN PSEUDOANEURYSM  Result Date: 08/13/2022  ARTERIAL PSEUDOANEURYSM  Patient Name:  Kristen Knox  Date of Exam:   08/13/2022 Medical Rec #: [id number]     Accession #:    [id number] Date of Birth: 1960-10-17     Patient Gender: F Patient Age:   62 years Exam Location:  [facility] Procedure:      VAS [place] Bobetta Lime Referring Phys: Lawernce Ion --------------------------------------------------------------------------------  Exam: Right groin Indications: Patient complains of palpable knot. History: S/P endovascular treatment of an unruptured left PCA region aneurysm. Performing Technologist: Marilynne Halsted RDMS, RVT  Examination Guidelines: A complete evaluation includes B-mode imaging,  spectral Doppler, color Doppler, and power Doppler as needed of all accessible portions of each vessel. Bilateral testing is considered an integral part of a complete examination. Limited examinations for reoccurring indications may be performed as noted. +------------+----------+---------+------+----------+ Right DuplexPSV (cm/s)Waveform PlaqueComment(s) +------------+----------+---------+------+----------+ CFA            101    triphasic                 +------------+----------+---------+------+----------+ PFA             44    biphasic                  +------------+----------+---------+------+----------+ Prox SFA        62    triphasic                 +------------+----------+---------+------+----------+ Right Vein comments:Patent right common femoral and proximal femoral veins.  Findings: A mixed echogenic structure measuring approximately 2.0 cm x 0.6 cm is visualized at the Right groin with ultrasound characteristics of a hematoma.  Summary: No evidence of pseudoaneurysm, AVF or DVT     --------------------------------------------------------------------------------    Preliminary     Labs:  CBC: Recent Labs    07/11/22 0835 08/12/22 0707 08/13/22 0515  WBC 4.6 4.8 7.4  HGB 13.2 11.3* 8.5*  HCT 38.4 34.3* 25.1*  PLT 266 281 221    COAGS: Recent Labs    07/11/22 0835 08/12/22 0655  INR 0.9 0.9    BMP: Recent Labs    07/11/22 0835 08/12/22 0655 08/13/22 0515  NA 128* 129* 137  K 3.7 3.8 3.5  CL 94* 99 107  CO2 23 18* 19*  GLUCOSE 79 86 118*  BUN <5* <5* 5*  CALCIUM 8.9 8.9 7.7*  CREATININE 0.52 0.47 0.41*  GFRNONAA >60 >60 >60    LIVER FUNCTION TESTS: Recent Labs    08/12/22 0655  BILITOT 0.6  AST 40  ALT 23  ALKPHOS 42  PROT 6.9  ALBUMIN 4.0    Assessment and Plan:  62 y.o. female with anxiety/depression, asthma, right breast cancer, HTN and CVA, recent angiogram on 07/11/22 showed high grade stenosis of the proximal right ICA and a  left ICA/posterior communicating artery aneurysm which has increased in size compared to prior studies, she is s/p L PCOM aneurysm embolization with a pipeline flow diverter by Dr. Corliss Skains on 08/12/22.  Right ICA stenosis  Patient will require cerebral angiogram and possible R ICA stent placement in the near future, however, possible GIB while on DAPT needs to be evaluated and managed before. NIR will follow and schedule the patient accordingly.   Anemia/ dark stool while on DAPT  CBC showed significant drop in hgb from 11.3 pre  procedure to 8.5 this morning. Patient currently asymptomatic.  Patient was started on Brilinta 90 mg BID and ASA 81 mg every day 5 days prior to the procedure, yesterday  she reported that she had dark stool on 7/4 and 7/5. No fresh blood in her stool.   - FOBT ordered  - P2Y12 ordered  - Brilinta changed from 90 mg to 60 mg BID  - if FOBT positive, may need to decrease Brilinta to 45 mg/ or switch to Plavix  - patient states that underwent colonoscopy with GI doctor in [place] years ago, showed some polyps, she was told "everything looked ok." She is not seeing GI currently.   Ecchymosis/tenderness around the R CFA puncture site  Manual compression held at the right groin puncture site for 25 minutes with quick clot after the procedure yesterday - VAS [place] ordered, showed 2 cm x 0.6 cm hematoma, no pseudoaneurysm, AVF or DVT  - 4 hour bedrest and right leg straight per Dr. Corliss Skains, bedrest to be over around 2 pm. RN notified.  HTN Episodes of hypotension after the procedure, home HTN meds held overnight.  - BP stable this morning, started on home HTN med lisinopril 40 mg  - continue lisinopril 40 mg   Anxiety/depression   Continue home meds Prozac 40 mg every day and xanax 1 mg QID PRN   Hypocalcemia   BMP this morning showed hypocalcemia 7.7, pt asymptomatic    - will monitor  Smoking  Patient was strongly encouraged to stop smoking, she states that the  nicotine path has been working.  Will plan to keep in the hospital one more night, patient agreeable.  NIR will follow, plan for d/c tomorrow.  Electronically Signed: Willette Brace, PA-C 08/13/2022, 2:12 PM   I spent a total of 25 Minutes at the the patient's bedside AND on the patient's hospital floor or unit, greater than 50% of which was counseling/coordinating care for L PCOM aneurysm tx follow up.   This chart was dictated using voice recognition software.  Despite best efforts to proofread,  errors can occur which can change the documentation meaning.

## 2022-08-13 NOTE — Progress Notes (Signed)
Patient seen with Dr. Corliss Skains again.   She is sitting in bed NAD, states that she has not had BM yet.  R groin site with stable ecchymosis.  Patient was informed to hold pressure over her right groin site when she walks, changes positions, having bowel movement to minimize risk of bleeding from the R CFA site, she verbalized understanding.   Colace 100 mg  2 times a day as needed ordered.  NIR will follow tomorrow, please contact Dr. Corliss Skains for questions and concerns overnight.    Lynann Bologna Korinne Greenstein PA-C 08/13/2022 4:12 PM

## 2022-08-13 NOTE — Anesthesia Postprocedure Evaluation (Signed)
Anesthesia Post Note  Patient: Kristen Knox  Procedure(s) Performed: LICA aneurysm embolization     Patient location during evaluation: PACU Anesthesia Type: General Level of consciousness: awake and alert Pain management: pain level controlled Vital Signs Assessment: post-procedure vital signs reviewed and stable Respiratory status: spontaneous breathing, nonlabored ventilation, respiratory function stable and patient connected to nasal cannula oxygen Cardiovascular status: blood pressure returned to baseline and stable Postop Assessment: no apparent nausea or vomiting Anesthetic complications: no   No notable events documented.  Last Vitals:  Vitals:   08/13/22 1543 08/13/22 1600  BP:  127/70  Pulse: 85 85  Resp: 16 18  Temp: 36.9 C   SpO2: 97% 97%    Last Pain:  Vitals:   08/13/22 1600  TempSrc:   PainSc: 0-No pain   Pain Goal:                   Trevor Iha

## 2022-08-14 ENCOUNTER — Other Ambulatory Visit: Payer: Self-pay | Admitting: Physician Assistant

## 2022-08-14 DIAGNOSIS — I671 Cerebral aneurysm, nonruptured: Secondary | ICD-10-CM

## 2022-08-14 LAB — PLATELET INHIBITION P2Y12

## 2022-08-14 LAB — OCCULT BLOOD X 1 CARD TO LAB, STOOL: Fecal Occult Bld: POSITIVE — AB

## 2022-08-14 MED ORDER — TICAGRELOR 60 MG PO TABS: 60.0000 mg | ORAL_TABLET | Freq: Two times a day (BID) | ORAL | 0 refills | Status: DC

## 2022-08-14 MED ORDER — TICAGRELOR 90 MG PO TABS: 45.0000 mg | ORAL_TABLET | Freq: Two times a day (BID) | ORAL | 0 refills | Status: DC

## 2022-08-14 NOTE — Progress Notes (Signed)
Discharge paperwork provided to pt. Reviewed s/s bleeding. Encouraged monitoring of groin site. Son en route. No further questions or concerns at this time.

## 2022-08-14 NOTE — Discharge Summary (Addendum)
Patient ID: KAEDENCE CONNELLY MRN: [id number] DOB/AGE: 1960/06/06 62 y.o.  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 female with a medical history significant for anxiety/depression, asthma, right breast cancer, HTN and CVA.   She is familiar to IR from prior angiograms 08/15/15, 09/12/16 and 07/11/22.   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    She met with Dr. Corliss Skains 07/16/22 to discuss these results and Dr. Corliss Skains discussed endovascular treatment versus conservative management.   After explaining the risks, benefits and alternatives the patient expressed a desire to proceed with endovascular treatment.   She was made aware that the procedure will be done under general anesthesia with planned overnight observation.   Dr. Corliss Skains ordered Brilinta to be started 5 days prior to the procedure which the patient did. The patient already takes a daily 81 mg aspirin.   She is status post left common carotid arteriogram done 08/12/22 by Dr. Corliss Skains.   Right CFA approach.   Findings:   Wide neck approximately a 4.3 mm x 4.3 mm left posterior communicating artery aneurysm.   Status post embolization with a 3.5 mm x 14 mm pipeline flex vantage flow diverter.   Post CT of the brain no evidence of intracranial hemorrhage.   Manual  compression held at the right groin puncture site for 25 minutes with quick clot.  Patient had reported  some black stool and Hgb dropped to 8. She stayed an additional day for monitoring of her Hgb.  FOB unable to be collected as patient did not have BM during hospital stay.  She also developed a right groin hematoma. The right groin is stable with moderate ecchymosis. There is no pseudoaneurysm.  I have sent an Epic message to Dr. Donnel Saxon for follow up.  She is tolerating a diet and ambulating without issue. She is asymptomatic from her anemia.  She is ready for discharge home.    Consults: None  Significant Diagnostic Studies:  Lab Results  Component Value Date   WBC 7.4 08/13/2022   HGB 8.5 (L) 08/13/2022   HCT 25.1 (L) 08/13/2022   MCV 94.4 08/13/2022   PLT 221 08/13/2022   Lab Results  Component Value Date   CREATININE 0.41 (L) 08/13/2022   BUN 5 (L) 08/13/2022   NA 137 08/13/2022   K 3.5 08/13/2022   CL 107 08/13/2022   CO2 19 (L) 08/13/2022     Treatments:  left common carotid arteriogram done 08/12/22 by Dr. Corliss Skains.   Right CFA approach.   Findings:   Wide neck approximately a 4.3 mm x 4.3 mm left posterior communicating artery aneurysm.   Status post embolization with a 3.5 mm x 14 mm pipeline flex vantage flow diverter.    Discharge Exam: Blood pressure 126/75, pulse 75, temperature 98.1 F (36.7 C), temperature source Oral, resp. rate 17, height 5\' 2"  (1.575 m), weight 108 lb (49 kg), SpO2 97 %. Alert, awake, and oriented x3. Speech and comprehension intact.  PERRL bilaterally. EOMs intact bilaterally without nystagmus or subjective diplopia. Visual fields intact bilaterally. No facial asymmetry. Tongue midline. Motor power symmetric proportional to effort. No pronator drift. Fine motor and coordination intact and symmetric. 5/5 Strength bilaterally Common femoral artery puncture site without pseudoaneurysm. Ecchymosis is stable.  Disposition: Discharge disposition: 01-Home or Self Care       Discharge Instructions     Call MD for:   redness, tenderness, or signs of infection (pain, swelling, redness, odor or green/yellow discharge around incision site)   Complete by: As directed    Call MD for:  severe uncontrolled pain   Complete by: As directed    Call MD for:  temperature >100.4   Complete by: As directed    Diet general   Complete by: As directed    Driving Restrictions   Complete by: As directed    No driving x 2 weeks, may ride in a car as passenger   Increase activity slowly   Complete by: As directed    Lifting restrictions   Complete by: As directed    No stooping, pushing, pulling, or strenuous activity x 2 weeks      Allergies as of 08/14/2022   No Known Allergies      Medication List     TAKE these medications    alprazolam 2 MG tablet Commonly known as: XANAX Take 2 mg by mouth 2 (two) times daily.   aspirin 81 MG chewable tablet Chew 81 mg by mouth daily.   ergocalciferol 1.25 MG (50000 UT) capsule Commonly known as: VITAMIN D2 Take 50,000 Units by mouth once a week.   FLUoxetine 40 MG capsule Commonly known as: PROZAC Take 40 mg by mouth daily.   ibuprofen 200 MG tablet Commonly known as: ADVIL Take 400 mg by mouth every 6 (six) hours as needed for headache, mild pain or moderate pain (Back pain).   lisinopril 40 MG tablet Commonly known as: ZESTRIL Take 40 mg by mouth daily.   Prolia 60 MG/ML Sosy injection Generic drug: denosumab Inject 60 mg into the skin every 6 (six) months.   rosuvastatin 20 MG tablet Commonly known as: CRESTOR Take 20 mg by mouth daily.   ticagrelor 90 MG Tabs tablet Commonly known as: Brilinta Take 0.5 tablets (45 mg total) by mouth 2 (two) times daily. What changed: how much to take       IR Scheduler will call for follow up in 2 weeks.  Electronically Signed: Gwynneth Macleod, PA-C 08/14/2022, 11:52 AM     I have spent Less Than 30 Minutes discharging Mariea R Favia.

## 2022-08-21 ENCOUNTER — Encounter (HOSPITAL_COMMUNITY): Payer: Self-pay

## 2022-08-21 ENCOUNTER — Other Ambulatory Visit (HOSPITAL_COMMUNITY): Payer: Self-pay | Admitting: Interventional Radiology

## 2022-08-21 DIAGNOSIS — I671 Cerebral aneurysm, nonruptured: Secondary | ICD-10-CM

## 2022-08-21 HISTORY — PX: IR ANGIO INTRA EXTRACRAN SEL INTERNAL CAROTID UNI L MOD SED: IMG5361

## 2022-08-21 HISTORY — PX: IR NEURO EACH ADD'L AFTER BASIC UNI LEFT (MS): IMG5373

## 2022-08-26 ENCOUNTER — Ambulatory Visit (HOSPITAL_COMMUNITY)
Admission: RE | Admit: 2022-08-26 | Discharge: 2022-08-26 | Disposition: A | Discharge status: Home / Self Care | Payer: 59 | Source: Ambulatory Visit | Attending: Physician Assistant | Admitting: Physician Assistant

## 2022-08-26 DIAGNOSIS — I671 Cerebral aneurysm, nonruptured: Secondary | ICD-10-CM

## 2022-08-27 HISTORY — PX: IR RADIOLOGIST EVAL & MGMT: IMG5224

## 2022-09-09 ENCOUNTER — Telehealth: Payer: Self-pay | Admitting: Student

## 2022-09-09 NOTE — Telephone Encounter (Signed)
Pharmacy called [facility] IR APP line with questions regarding patient's prescription for Prolia and Vitamin D2 medications. Pharmacy representative was informed that [facility] is not managing these medications for the patient and advised them to contact the original prescribing provider for management of these medications.  Kennieth Francois, PA-C 09/09/2022

## 2022-09-12 ENCOUNTER — Other Ambulatory Visit (HOSPITAL_COMMUNITY): Payer: Self-pay | Admitting: Interventional Radiology

## 2022-09-12 DIAGNOSIS — I6521 Occlusion and stenosis of right carotid artery: Secondary | ICD-10-CM

## 2022-09-17 ENCOUNTER — Telehealth (HOSPITAL_COMMUNITY): Payer: Self-pay

## 2022-09-17 NOTE — Telephone Encounter (Signed)
Called to find out what pt decided to do her procedure. No answer, left vm. AB

## 2022-09-19 ENCOUNTER — Other Ambulatory Visit: Payer: Self-pay

## 2022-09-19 ENCOUNTER — Encounter (HOSPITAL_COMMUNITY): Payer: Self-pay | Admitting: Interventional Radiology

## 2022-09-19 NOTE — Progress Notes (Signed)
PCP - Dr Donnel Saxon Cardiologist - none  Chest x-ray - none EKG - 08/12/22 Stress Test - none ECHO - 1-2 yrs ago Cardiac Cath - none  ICD Pacemaker/Loop - none  Sleep Study -  n/a CPAP - none  Diabetes Type 2, no meds, does not check blood sugar.  Patient is unaware of this dx.  ASA/ Blood Thinner Instructions:  Follow your surgeon's instructions on ASA and Brilinta prior to surgery.   NPO  Anesthesia review: Yes  STOP now taking any Aspirin (unless otherwise instructed by your surgeon), Aleve, Naproxen, Ibuprofen, Motrin, Advil, Goody's, BC's, all herbal medications, fish oil, and all vitamins.   Coronavirus Screening Do you have any of the following symptoms:  Cough yes/no: No Fever (>100.68F)  yes/no: No Runny nose yes/no: No Sore throat yes/no: No Difficulty breathing/shortness of breath  yes/no: No  Have you traveled in the last 14 days and where? yes/no: No  Patient verbalized understanding of instructions that were given via phone.

## 2022-09-20 ENCOUNTER — Other Ambulatory Visit: Payer: Self-pay | Admitting: Radiology

## 2022-09-20 DIAGNOSIS — I771 Stricture of artery: Secondary | ICD-10-CM

## 2022-09-22 NOTE — Anesthesia Preprocedure Evaluation (Signed)
Anesthesia Evaluation  Patient identified by MRN, date of birth, ID band Patient awake    Reviewed: Allergy & Precautions, NPO status , Patient's Chart, lab work & pertinent test results  History of Anesthesia Complications Negative for: history of anesthetic complications  Airway Mallampati: III  TM Distance: >3 FB Neck ROM: Full    Dental  (+) Teeth Intact, Dental Advisory Given   Pulmonary asthma , Current SmokerPatient did not abstain from smoking. 50 pack year history >1ppd Smoked this AM   Pulmonary exam normal breath sounds clear to auscultation       Cardiovascular hypertension (122/80 preop), Pt. on medications Normal cardiovascular exam Rhythm:Regular Rate:Normal  severe stenosis of right internal carotid artery   Neuro/Psych   Anxiety Depression    CVA (2018)    GI/Hepatic negative GI ROS,,,(+)       alcohol use28 drinks/wk   Endo/Other  diabetes, Well Controlled, Type 2Hypothyroidism    Renal/GU negative Renal ROS  negative genitourinary   Musculoskeletal  (+) Arthritis , Osteoarthritis,    Abdominal   Peds  Hematology negative hematology ROS (+)   Anesthesia Other Findings Day of surgery medications reviewed with patient.  Reproductive/Obstetrics negative OB ROS                             Anesthesia Physical Anesthesia Plan  ASA: 3  Anesthesia Plan: General   Post-op Pain Management:    Induction: Intravenous  PONV Risk Score and Plan: Treatment may vary due to age or medical condition, Ondansetron, Dexamethasone and Midazolam  Airway Management Planned: Oral ETT  Additional Equipment: Arterial line  Intra-op Plan:   Post-operative Plan: Extubation in OR  Informed Consent: I have reviewed the patients History and Physical, chart, labs and discussed the procedure including the risks, benefits and alternatives for the proposed anesthesia with the patient or  authorized representative who has indicated his/her understanding and acceptance.     Dental advisory given  Plan Discussed with: CRNA  Anesthesia Plan Comments:         Anesthesia Quick Evaluation

## 2022-09-22 NOTE — H&P (Signed)
Chief Complaint: Severe right internal carotid artery proximal stenosis. Patient present for cerebral angiogram with possible intervention with general anesthesia for known RICA stenosis.    Supervising Physician: Julieanne Cotton  Patient Status: [facility] - Out-pt  History of Present Illness: Kristen Knox is a 62 y.o. female outpatient. Smoker. Known to NIR. History of  CVA, hypothyroidism, HTN, HLD, DM, breast cancer. Found to have an enlarging left posterior communicating artery region  (On CT) and high-grade stenosis of the right internal carotid artery proximally diagnostic cerebral arteriogram ( 8.10.18 and 6.10.24) while being worked up for headaches. [name] underwent intervention on 7.8.24 which consisted of  endovascular treatment of wide neck lobulated left posterior communicating artery region aneurysm with placement of a 3.5 mm x 14 mm pipeline Vantage flow diversion device. Patient  and her family were seen in [facility] clinic on 7.22.23  for follow up with Dr. Fatima Sanger. Management options were discussed at that time.  This included procedure risks and benefits. The decision was made to tentatively to treat severe right internal carotid artery proximal stenosis with anesthesia. Patient present for cerebral angiogram with possible intervention with general anesthesia for known RICA stenosis.   Currently without any significant complaints. States that she is nervous.  Patient alert and laying in bed,calm. Denies any fevers, headache, chest pain, SOB, cough, abdominal pain, nausea, vomiting or bleeding. Return precautions and treatment recommendations and follow-up discussed with the patient  who is agreeable with the plan.   Past Medical History:  Diagnosis Date   Anemia    Anxiety    Asthma    Cancer (HCC) 02/05/2012   Rt breast Ca   Depression    Diabetes mellitus without complication (HCC)    type 2 - no meds, patient is not aware of this dx.   Dyspnea    with exertion and pt  is a smoker   Headache    HLD (hyperlipidemia)    Hypertension    Hypothyroidism    Osteoarthritis    Stroke (HCC) 2018   Thyroid nodule 2020    Past Surgical History:  Procedure Laterality Date   BREAST SURGERY Right 02/05/2012   mastectomy   CHOLECYSTECTOMY     FRACTURE SURGERY Left 2022   wrist   IR 3D INDEPENDENT WKST  09/12/2016   IR 3D INDEPENDENT WKST  07/11/2022   IR 3D INDEPENDENT WKST  08/12/2022   IR ANGIO INTRA EXTRACRAN SEL COM CAROTID INNOMINATE BILAT MOD SED  09/12/2016   IR ANGIO INTRA EXTRACRAN SEL COM CAROTID INNOMINATE BILAT MOD SED  07/11/2022   IR ANGIO INTRA EXTRACRAN SEL INTERNAL CAROTID UNI L MOD SED  08/21/2022   IR ANGIO VERTEBRAL SEL SUBCLAVIAN INNOMINATE UNI L MOD SED  09/12/2016   IR ANGIO VERTEBRAL SEL SUBCLAVIAN INNOMINATE UNI L MOD SED  07/11/2022   IR ANGIO VERTEBRAL SEL VERTEBRAL UNI R MOD SED  09/12/2016   IR ANGIO VERTEBRAL SEL VERTEBRAL UNI R MOD SED  07/11/2022   IR CT HEAD LTD  08/12/2022   IR CT HEAD LTD  08/12/2022   IR GENERIC HISTORICAL  09/05/2015   IR RADIOLOGIST EVAL & MGMT 09/05/2015 [facility]-INTERV RAD   IR NEURO EACH ADD'L AFTER BASIC UNI LEFT (MS)  08/21/2022   IR RADIOLOGIST EVAL & MGMT  10/23/2016   IR RADIOLOGIST EVAL & MGMT  07/04/2022   IR RADIOLOGIST EVAL & MGMT  07/18/2022   IR RADIOLOGIST EVAL & MGMT  08/27/2022   IR TRANSCATH/EMBOLIZ  08/12/2022  MASTECTOMY     RADIOLOGY WITH ANESTHESIA N/A 08/12/2022   Procedure: LICA aneurysm embolization;  Surgeon: Julieanne Cotton, MD;  Location: [facility] OR;  Service: Radiology;  Laterality: N/A;   TUBAL LIGATION      Allergies: Patient has no known allergies.  Medications: Prior to Admission medications   Medication Sig Start Date End Date Taking? Authorizing Provider  alprazolam Prudy Feeler) 2 MG tablet Take 2 mg by mouth 2 (two) times daily. Patient takes Xanax 1 mg tab four times daily    [provider]  aspirin 81 MG chewable tablet Chew 81 mg by mouth 2 (two) times daily.     [provider]  FLUoxetine (PROZAC) 40 MG capsule Take 40 mg by mouth at bedtime.    [provider]  ibuprofen (ADVIL) 200 MG tablet Take 400 mg by mouth every 6 (six) hours as needed for headache, mild pain or moderate pain (Back pain).    [provider]  lisinopril (PRINIVIL,ZESTRIL) 40 MG tablet Take 40 mg by mouth at bedtime.    [provider]  rosuvastatin (CRESTOR) 20 MG tablet Take 20 mg by mouth at bedtime. 05/25/21   [provider]  ticagrelor (BRILINTA) 90 MG TABS tablet Take 0.5 tablets (45 mg total) by mouth 2 (two) times daily. 08/14/22 11/12/22  Gershon Crane, PA-C     No family history on file.  Social History   Socioeconomic History   Marital status: Widowed    Spouse name: Not on file   Number of children: Not on file   Years of education: Not on file   Highest education level: Not on file  Occupational History   Not on file  Tobacco Use   Smoking status: Every Day    Current packs/day: 1.00    Average packs/day: 1 pack/day for 50.0 years (50.0 ttl pk-yrs)    Types: Cigarettes   Smokeless tobacco: Never  Vaping Use   Vaping status: Never Used  Substance and Sexual Activity   Alcohol use: Yes    Alcohol/week: 28.0 - 42.0 standard drinks of alcohol    Types: 28 - 42 Cans of beer per week    Comment: 4-6 beers a night   Drug use: Never   Sexual activity: Not Currently    Birth control/protection: Post-menopausal  Other Topics Concern   Not on file  Social History Narrative   Not on file   Social Determinants of Health   Financial Resource Strain: Not on file  Food Insecurity: No Food Insecurity (08/12/2022)   Hunger Vital Sign    Worried About Running Out of Food in the Last Year: Never true    Ran Out of Food in the Last Year: Never true  Transportation Needs: No Transportation Needs (08/12/2022)   PRAPARE - Administrator, Civil Service (Medical): No    Lack of Transportation (Non-Medical):  No  Physical Activity: Not on file  Stress: Not on file  Social Connections: Not on file     Review of Systems: A 12 point ROS discussed and pertinent positives are indicated in the HPI above.  All other systems are negative.  Review of Systems  Constitutional:  Negative for fatigue and fever.  HENT:  Negative for congestion.   Respiratory:  Negative for cough and shortness of breath.   Gastrointestinal:  Negative for abdominal pain, diarrhea, nausea and vomiting.    Vital Signs: 122/80 rr 18 HR 80 temp 98.3  Physical Exam Vitals and nursing  note reviewed.  Constitutional:      Appearance: She is well-developed.  HENT:     Head: Normocephalic and atraumatic.  Eyes:     Conjunctiva/sclera: Conjunctivae normal.  Cardiovascular:     Rate and Rhythm: Normal rate and regular rhythm.     Heart sounds: Normal heart sounds.  Pulmonary:     Effort: Pulmonary effort is normal.     Breath sounds: Normal breath sounds.  Musculoskeletal:        General: Normal range of motion.     Cervical back: Normal range of motion.  Skin:    General: Skin is warm and dry.  Neurological:     General: No focal deficit present.     Mental Status: She is alert and oriented to person, place, and time.  Psychiatric:        Mood and Affect: Mood normal.        Behavior: Behavior normal.        Thought Content: Thought content normal.        Judgment: Judgment normal.     Imaging: IR Radiologist Eval & Mgmt  Result Date: 08/27/2022 EXAM: ESTABLISHED PATIENT OFFICE VISIT CHIEF COMPLAINT: Follow-up visit. Current Pain Level: 1-10 HISTORY OF PRESENT ILLNESS: Patient is a 62 year old right handed lady who underwent endovascular treatment of an unruptured left internal carotid artery posterior communicating artery region aneurysm using a pipeline Vantage flow diversion device on 08/12/2022. There were no periprocedural or postprocedural complications. The patient was then discharged home under the care  of her family. The patient returns today accompanied by her family. She reports no neurological symptoms of headaches, nausea or vomiting, visual symptoms, arm or leg numbness or weakness, or of incoordination. She reports no passing of blood in her stool since her discharge. She reports she is back to her near normal level of activity. The patient is fully functional requiring no help. She reportedly visited with her primary care. According to the patient, her hemoglobin was checked which she reports was around 10. She reports no recent chest pain, shortness of breath, palpitations or pedal edema. She reports no history of wheezing, coughing or of hemoptysis. Appetite has returned to normal. She reports no abdominal pain, constipation, diarrhea or melena. She denies any difficulty with dysuria, hematuria, or with polyuria. She reports no recent chills, fever or rigors. She continues to smoke a pack of cigarettes per day. Past Medical History: Unchanged. Medications: Unchanged. Allergies: Unchanged. Social History: Unchanged. Family History: Unchanged. REVIEW OF SYSTEMS: Negative unless as mentioned above. PHYSICAL EXAMINATION: Alert, awake, oriented to time, place, space. Speech and comprehension intact. Normal eye contact. No gross lateralizing abnormal neurological features noted. Right groin was examined with a female chaperone. Resolving bruising from the time of the procedure was evident without focal areas of swelling, or of fresh bruising. ASSESSMENT AND PLAN: Patient advised to continue with her aspirin 81 mg a day, and with Brilinta 45 mg twice a day. Patient advised to maintain adequate hydration and to discuss with her primary regarding cessation of smoking planning. Plan tentatively to treat severe right internal carotid artery proximal stenosis with anesthesia in about 3-4 weeks from today. Patient expressed understanding and agreement with the above management plan. Electronically Signed   By: Julieanne Cotton M.D.   On: 08/27/2022 07:55    Labs:  CBC: Recent Labs    07/11/22 0835 08/12/22 0707 08/13/22 0515 09/23/22 0626  WBC 4.6 4.8 7.4 4.3  HGB 13.2 11.3* 8.5* 10.3*  HCT 38.4 34.3* 25.1* 31.7*  PLT 266 281 221 329    COAGS: Recent Labs    07/11/22 0835 08/12/22 0655 09/23/22 0626  INR 0.9 0.9 0.9    BMP: Recent Labs    07/11/22 0835 08/12/22 0655 08/13/22 0515 09/23/22 0626  NA 128* 129* 137 129*  K 3.7 3.8 3.5 3.9  CL 94* 99 107 102  CO2 23 18* 19* 18*  GLUCOSE 79 86 118* 78  BUN <5* <5* 5* <5*  CALCIUM 8.9 8.9 7.7* 8.6*  CREATININE 0.52 0.47 0.41* 0.59  GFRNONAA >60 >60 >60 >60    LIVER FUNCTION TESTS: Recent Labs    08/12/22 0655  BILITOT 0.6  AST 40  ALT 23  ALKPHOS 42  PROT 6.9  ALBUMIN 4.0    Assessment and Plan:  62 y.o. female. Outpatient. Smoker. Known to NIR. History of  CVA, hypothyroidism, HTN, HLD, DM, breast cancer. Found to have an enlarging left posterior communicating artery region  (On CT) and high-grade stenosis of the right internal carotid artery proximally diagnostic cerebral arteriogram ( 8.10.18 and 6.10.24) while being worked up for headaches. Ms Steedman underwent intervention on 7.8.24 which consisted of  endovascular treatment of wide neck lobulated left posterior communicating artery region aneurysm with placement of a 3.5 mm x 14 mm pipeline Vantage flow diversion device. Patient  and her family were seen in [facility] clinic on 7.22.23  for follow up with Dr. Fatima Sanger. Management options were discussed at that time.  This included procedure risks and benefits. The decision was made to tentatively to treat severe right internal carotid artery proximal stenosis with anesthesia. Patient present for cerebral angiogram with possible intervention with general anesthesia for known RICA stenosis.   Patient is on ASA and Brillinta (1/2 tablet). NKDA. Patient has been NPO since midnight.   Risks and benefits of cerebral arteriogram  with intervention were discussed with the patient including, but not limited to bleeding, infection, vascular injury, contrast induced renal failure, stroke, reperfusion hemorrhage, or even death. This interventional procedure involves the use of X-rays and because of the nature of the planned procedure, it is possible that we will have prolonged use of X-ray fluoroscopy. Potential radiation risks to you include (but are not limited to) the following: - A slightly elevated risk for cancer  several years later in life. This risk is typically less than 0.5% percent. This risk is low in comparison to the normal incidence of human cancer, which is 33% for women and 50% for men according to the [employer] Society. - Radiation induced injury can include skin redness, resembling a rash, tissue breakdown / ulcers and hair loss (which can be temporary or permanent).  The likelihood of either of these occurring depends on the difficulty of the procedure and whether you are sensitive to radiation due to previous procedures, disease, or genetic conditions.  IF your procedure requires a prolonged use of radiation, you will be notified and given written instructions for further action.  It is your responsibility to monitor the irradiated area for the 2 weeks following the procedure and to notify your physician if you are concerned that you have suffered a radiation induced injury.   All of the patient's questions were answered, patient is agreeable to proceed. Consent signed and in chart.   Thank you for this interesting consult.  I greatly enjoyed meeting Antonia R Haze and look forward to participating in their care.  A copy of this report was sent to  the requesting provider on this date.  Electronically Signed: Alene Mires, NP 09/23/2022, 7:42 AM   I spent a total of    25 Minutes in face to face in clinical consultation, greater than 50% of which was counseling/coordinating care for cerebral  angiogram with intervention

## 2022-09-22 NOTE — H&P (Signed)
  The note originally documented on this encounter has been moved the the encounter in which it belongs.  

## 2022-09-23 ENCOUNTER — Inpatient Hospital Stay (HOSPITAL_COMMUNITY): Payer: 59 | Admitting: Registered Nurse

## 2022-09-23 ENCOUNTER — Encounter (HOSPITAL_COMMUNITY)
Admission: RE | Disposition: A | Discharge status: Home / Self Care | Payer: Self-pay | Source: Home / Self Care | Attending: Interventional Radiology

## 2022-09-23 ENCOUNTER — Inpatient Hospital Stay (HOSPITAL_COMMUNITY)
Admission: RE | Admit: 2022-09-23 | Discharge: 2022-09-23 | Disposition: A | Discharge status: Home / Self Care | Payer: 59 | Source: Ambulatory Visit | Attending: Interventional Radiology | Admitting: Interventional Radiology

## 2022-09-23 ENCOUNTER — Encounter (HOSPITAL_COMMUNITY): Payer: Self-pay | Admitting: Registered Nurse

## 2022-09-23 ENCOUNTER — Inpatient Hospital Stay (HOSPITAL_COMMUNITY)
Admission: RE | Admit: 2022-09-23 | Discharge: 2022-09-24 | DRG: 039 | Disposition: A | Discharge status: Home / Self Care | Payer: 59 | Attending: Interventional Radiology | Admitting: Interventional Radiology

## 2022-09-23 DIAGNOSIS — M1990 Unspecified osteoarthritis, unspecified site: Secondary | ICD-10-CM | POA: Diagnosis present

## 2022-09-23 DIAGNOSIS — F32A Depression, unspecified: Secondary | ICD-10-CM | POA: Diagnosis present

## 2022-09-23 DIAGNOSIS — Z7982 Long term (current) use of aspirin: Secondary | ICD-10-CM

## 2022-09-23 DIAGNOSIS — E119 Type 2 diabetes mellitus without complications: Secondary | ICD-10-CM | POA: Diagnosis present

## 2022-09-23 DIAGNOSIS — Z9011 Acquired absence of right breast and nipple: Secondary | ICD-10-CM

## 2022-09-23 DIAGNOSIS — Z9049 Acquired absence of other specified parts of digestive tract: Secondary | ICD-10-CM | POA: Diagnosis not present

## 2022-09-23 DIAGNOSIS — J45909 Unspecified asthma, uncomplicated: Secondary | ICD-10-CM

## 2022-09-23 DIAGNOSIS — I959 Hypotension, unspecified: Secondary | ICD-10-CM | POA: Diagnosis not present

## 2022-09-23 DIAGNOSIS — I10 Essential (primary) hypertension: Secondary | ICD-10-CM | POA: Diagnosis not present

## 2022-09-23 DIAGNOSIS — Z853 Personal history of malignant neoplasm of breast: Secondary | ICD-10-CM | POA: Diagnosis not present

## 2022-09-23 DIAGNOSIS — Z8673 Personal history of transient ischemic attack (TIA), and cerebral infarction without residual deficits: Secondary | ICD-10-CM | POA: Diagnosis not present

## 2022-09-23 DIAGNOSIS — I6521 Occlusion and stenosis of right carotid artery: Secondary | ICD-10-CM | POA: Diagnosis present

## 2022-09-23 DIAGNOSIS — F419 Anxiety disorder, unspecified: Secondary | ICD-10-CM | POA: Diagnosis present

## 2022-09-23 DIAGNOSIS — Z7902 Long term (current) use of antithrombotics/antiplatelets: Secondary | ICD-10-CM | POA: Diagnosis not present

## 2022-09-23 DIAGNOSIS — F17210 Nicotine dependence, cigarettes, uncomplicated: Secondary | ICD-10-CM | POA: Diagnosis not present

## 2022-09-23 DIAGNOSIS — I6529 Occlusion and stenosis of unspecified carotid artery: Secondary | ICD-10-CM

## 2022-09-23 DIAGNOSIS — Z79899 Other long term (current) drug therapy: Secondary | ICD-10-CM | POA: Diagnosis not present

## 2022-09-23 DIAGNOSIS — E785 Hyperlipidemia, unspecified: Secondary | ICD-10-CM | POA: Diagnosis present

## 2022-09-23 DIAGNOSIS — I771 Stricture of artery: Secondary | ICD-10-CM

## 2022-09-23 HISTORY — DX: Hypothyroidism, unspecified: E03.9

## 2022-09-23 HISTORY — PX: IR INTRAVSC STENT CERV CAROTID W/EMB-PROT MOD SED INCL ANGIO: IMG2303

## 2022-09-23 HISTORY — DX: Dyspnea, unspecified: R06.00

## 2022-09-23 HISTORY — DX: Type 2 diabetes mellitus without complications: E11.9

## 2022-09-23 HISTORY — DX: Unspecified osteoarthritis, unspecified site: M19.90

## 2022-09-23 HISTORY — PX: RADIOLOGY WITH ANESTHESIA: SHX6223

## 2022-09-23 HISTORY — DX: Hyperlipidemia, unspecified: E78.5

## 2022-09-23 LAB — POCT ACTIVATED CLOTTING TIME
Activated Clotting Time: 195 seconds
Activated Clotting Time: 195 seconds
Activated Clotting Time: 214 seconds

## 2022-09-23 LAB — CBC WITH DIFFERENTIAL/PLATELET
Abs Immature Granulocytes: 0.03 10*3/uL (ref 0.00–0.07)
Basophils Absolute: 0.1 10*3/uL (ref 0.0–0.1)
Basophils Relative: 2 %
Eosinophils Absolute: 0.1 10*3/uL (ref 0.0–0.5)
Eosinophils Relative: 3 %
HCT: 31.7 % — ABNORMAL LOW (ref 36.0–46.0)
Hemoglobin: 10.3 g/dL — ABNORMAL LOW (ref 12.0–15.0)
Immature Granulocytes: 1 %
Lymphocytes Relative: 21 %
Lymphs Abs: 0.9 10*3/uL (ref 0.7–4.0)
MCH: 30.8 pg (ref 26.0–34.0)
MCHC: 32.5 g/dL (ref 30.0–36.0)
MCV: 94.9 fL (ref 80.0–100.0)
Monocytes Absolute: 0.5 10*3/uL (ref 0.1–1.0)
Monocytes Relative: 11 %
Neutro Abs: 2.7 10*3/uL (ref 1.7–7.7)
Neutrophils Relative %: 62 %
Platelets: 329 10*3/uL (ref 150–400)
RBC: 3.34 MIL/uL — ABNORMAL LOW (ref 3.87–5.11)
RDW: 13.7 % (ref 11.5–15.5)
WBC: 4.3 10*3/uL (ref 4.0–10.5)
nRBC: 0 % (ref 0.0–0.2)

## 2022-09-23 LAB — GLUCOSE, CAPILLARY
Glucose-Capillary: 87 mg/dL (ref 70–99)
Glucose-Capillary: 131 mg/dL — ABNORMAL HIGH (ref 70–99)

## 2022-09-23 LAB — HEPARIN LEVEL (UNFRACTIONATED): Heparin Unfractionated: 0.14 [IU]/mL — ABNORMAL LOW (ref 0.30–0.70)

## 2022-09-23 LAB — BASIC METABOLIC PANEL
Anion gap: 9 (ref 5–15)
BUN: <5 mg/dL — ABNORMAL LOW (ref 8–23)
CO2: 18 mmol/L — ABNORMAL LOW (ref 22–32)
Calcium: 8.6 mg/dL — ABNORMAL LOW (ref 8.9–10.3)
Chloride: 102 mmol/L (ref 98–111)
Creatinine, Ser: 0.59 mg/dL (ref 0.44–1.00)
GFR, Estimated: >60 mL/min (ref >60)
Glucose, Bld: 78 mg/dL (ref 70–99)
Potassium: 3.9 mmol/L (ref 3.5–5.1)
Sodium: 129 mmol/L — ABNORMAL LOW (ref 135–145)

## 2022-09-23 LAB — PROTIME-INR
INR: 0.9 (ref 0.8–1.2)
Prothrombin Time: 12.2 seconds (ref 11.4–15.2)

## 2022-09-23 SURGERY — IR WITH ANESTHESIA
Anesthesia: General

## 2022-09-23 MED ORDER — CHLORHEXIDINE GLUCONATE 0.12 % MT SOLN
OROMUCOSAL | Status: AC
  Administered 2022-09-23: 15 mL via OROMUCOSAL
  Filled 2022-09-23: qty 15

## 2022-09-23 MED ORDER — FENTANYL CITRATE (PF) 100 MCG/2ML IJ SOLN
INTRAMUSCULAR | Status: AC
  Filled 2022-09-23: qty 2

## 2022-09-23 MED ORDER — ONDANSETRON HCL 4 MG/2ML IJ SOLN
INTRAMUSCULAR | Status: DC | PRN
  Administered 2022-09-23: 4 mg via INTRAVENOUS

## 2022-09-23 MED ORDER — CLEVIDIPINE BUTYRATE 0.5 MG/ML IV EMUL: 0.0000 mg/h | INTRAVENOUS | Status: AC

## 2022-09-23 MED ORDER — OXYCODONE HCL 5 MG PO TABS: 5.0000 mg | ORAL_TABLET | Freq: Once | ORAL | Status: DC | PRN

## 2022-09-23 MED ORDER — NIMODIPINE 30 MG PO CAPS
0.0000 mg | ORAL_CAPSULE | ORAL | Status: DC
  Filled 2022-09-23: qty 2

## 2022-09-23 MED ORDER — ESMOLOL HCL 100 MG/10ML IV SOLN
INTRAVENOUS | Status: DC | PRN
  Administered 2022-09-23: 30 mg via INTRAVENOUS

## 2022-09-23 MED ORDER — EPTIFIBATIDE 20 MG/10ML IV SOLN
INTRAVENOUS | Status: AC | PRN
  Administered 2022-09-23: 1.5 mg via INTRAVENOUS

## 2022-09-23 MED ORDER — LIDOCAINE 2% (20 MG/ML) 5 ML SYRINGE
INTRAMUSCULAR | Status: DC | PRN
  Administered 2022-09-23: 60 mg via INTRAVENOUS

## 2022-09-23 MED ORDER — MIDAZOLAM HCL 2 MG/2ML IJ SOLN
INTRAMUSCULAR | Status: AC
  Filled 2022-09-23: qty 2

## 2022-09-23 MED ORDER — DEXMEDETOMIDINE HCL IN NACL 80 MCG/20ML IV SOLN
INTRAVENOUS | Status: DC | PRN
  Administered 2022-09-23 (×2): 10 ug via INTRAVENOUS

## 2022-09-23 MED ORDER — ASPIRIN 81 MG PO CHEW: 81.0000 mg | CHEWABLE_TABLET | Freq: Every day | ORAL | Status: DC

## 2022-09-23 MED ORDER — CHLORHEXIDINE GLUCONATE 0.12 % MT SOLN: 15.0000 mL | Freq: Once | OROMUCOSAL | Status: AC

## 2022-09-23 MED ORDER — IOHEXOL 300 MG/ML  SOLN
150.0000 mL | Freq: Once | INTRAMUSCULAR | Status: AC | PRN
  Administered 2022-09-23: 90 mL via INTRA_ARTERIAL

## 2022-09-23 MED ORDER — FENTANYL CITRATE (PF) 250 MCG/5ML IJ SOLN
INTRAMUSCULAR | Status: DC | PRN
  Administered 2022-09-23 (×3): 50 ug via INTRAVENOUS

## 2022-09-23 MED ORDER — ACETAMINOPHEN 160 MG/5ML PO SOLN: 650.0000 mg | ORAL | Status: DC | PRN

## 2022-09-23 MED ORDER — PROPOFOL 10 MG/ML IV BOLUS
INTRAVENOUS | Status: DC | PRN
  Administered 2022-09-23: 100 mg via INTRAVENOUS
  Administered 2022-09-23: 50 mg via INTRAVENOUS

## 2022-09-23 MED ORDER — CEFAZOLIN SODIUM-DEXTROSE 2-4 GM/100ML-% IV SOLN
2.0000 g | INTRAVENOUS | Status: AC
  Administered 2022-09-23: 2 g via INTRAVENOUS

## 2022-09-23 MED ORDER — SODIUM CHLORIDE 0.9 % IV BOLUS
250.0000 mL | Freq: Once | INTRAVENOUS | Status: AC
  Administered 2022-09-23: 250 mL via INTRAVENOUS

## 2022-09-23 MED ORDER — DEXAMETHASONE SODIUM PHOSPHATE 10 MG/ML IJ SOLN
INTRAMUSCULAR | Status: DC | PRN
  Administered 2022-09-23: 10 mg via INTRAVENOUS

## 2022-09-23 MED ORDER — TICAGRELOR 90 MG PO TABS
90.0000 mg | ORAL_TABLET | Freq: Two times a day (BID) | ORAL | Status: DC
  Filled 2022-09-23: qty 1

## 2022-09-23 MED ORDER — ORAL CARE MOUTH RINSE: 15.0000 mL | OROMUCOSAL | Status: DC | PRN

## 2022-09-23 MED ORDER — SODIUM CHLORIDE 0.9 % IV SOLN: 250.0000 mL | INTRAVENOUS | Status: DC

## 2022-09-23 MED ORDER — INSULIN ASPART 100 UNIT/ML IJ SOLN: 0.0000 [IU] | INTRAMUSCULAR | Status: DC | PRN

## 2022-09-23 MED ORDER — LIDOCAINE HCL 1 % IJ SOLN
INTRAMUSCULAR | Status: AC
  Filled 2022-09-23: qty 20

## 2022-09-23 MED ORDER — ROCURONIUM BROMIDE 10 MG/ML (PF) SYRINGE
PREFILLED_SYRINGE | INTRAVENOUS | Status: DC | PRN
  Administered 2022-09-23: 10 mg via INTRAVENOUS
  Administered 2022-09-23: 50 mg via INTRAVENOUS

## 2022-09-23 MED ORDER — OXYCODONE HCL 5 MG/5ML PO SOLN: 5.0000 mg | Freq: Once | ORAL | Status: DC | PRN

## 2022-09-23 MED ORDER — ACETAMINOPHEN 500 MG PO TABS
1000.0000 mg | ORAL_TABLET | Freq: Once | ORAL | Status: AC
  Administered 2022-09-23: 1000 mg via ORAL
  Filled 2022-09-23: qty 2

## 2022-09-23 MED ORDER — HEPARIN SODIUM (PORCINE) 1000 UNIT/ML IJ SOLN
INTRAMUSCULAR | Status: DC | PRN
  Administered 2022-09-23: 3000 [IU] via INTRAVENOUS
  Administered 2022-09-23: 1000 [IU] via INTRAVENOUS

## 2022-09-23 MED ORDER — CLEVIDIPINE BUTYRATE 0.5 MG/ML IV EMUL
INTRAVENOUS | Status: AC
  Filled 2022-09-23: qty 50

## 2022-09-23 MED ORDER — ACETAMINOPHEN 325 MG PO TABS: 650.0000 mg | ORAL_TABLET | ORAL | Status: DC | PRN

## 2022-09-23 MED ORDER — SODIUM CHLORIDE 0.9 % IV SOLN: INTRAVENOUS | Status: DC

## 2022-09-23 MED ORDER — EPTIFIBATIDE 20 MG/10ML IV SOLN
INTRAVENOUS | Status: AC | PRN
Start: 2022-09-23 — End: 2022-09-23
  Administered 2022-09-23 (×2): 1.5 mg via INTRA_ARTERIAL

## 2022-09-23 MED ORDER — CEFAZOLIN SODIUM-DEXTROSE 2-4 GM/100ML-% IV SOLN
INTRAVENOUS | Status: AC
  Filled 2022-09-23: qty 100

## 2022-09-23 MED ORDER — LACTATED RINGERS IV SOLN: INTRAVENOUS | Status: DC | PRN

## 2022-09-23 MED ORDER — EPTIFIBATIDE 20 MG/10ML IV SOLN
INTRAVENOUS | Status: AC
  Filled 2022-09-23: qty 10

## 2022-09-23 MED ORDER — HEPARIN (PORCINE) 25000 UT/250ML-% IV SOLN
INTRAVENOUS | Status: AC
  Filled 2022-09-23: qty 250

## 2022-09-23 MED ORDER — EPHEDRINE SULFATE-NACL 50-0.9 MG/10ML-% IV SOSY
PREFILLED_SYRINGE | INTRAVENOUS | Status: DC | PRN
  Administered 2022-09-23: 5 mg via INTRAVENOUS

## 2022-09-23 MED ORDER — ALPRAZOLAM 0.5 MG PO TABS
1.0000 mg | ORAL_TABLET | Freq: Once | ORAL | Status: AC
  Administered 2022-09-23: 1 mg via ORAL
  Filled 2022-09-23: qty 2

## 2022-09-23 MED ORDER — PHENYLEPHRINE HCL-NACL 20-0.9 MG/250ML-% IV SOLN
25.0000 ug/min | INTRAVENOUS | Status: DC
  Administered 2022-09-23: 25 ug/min via INTRAVENOUS
  Filled 2022-09-23: qty 250

## 2022-09-23 MED ORDER — CHLORHEXIDINE GLUCONATE CLOTH 2 % EX PADS
6.0000 | MEDICATED_PAD | Freq: Every day | CUTANEOUS | Status: DC
  Administered 2022-09-23 – 2022-09-24 (×2): 6 via TOPICAL

## 2022-09-23 MED ORDER — TICAGRELOR 90 MG PO TABS
90.0000 mg | ORAL_TABLET | Freq: Two times a day (BID) | ORAL | Status: DC
  Administered 2022-09-23 – 2022-09-24 (×2): 90 mg via ORAL
  Filled 2022-09-23: qty 1

## 2022-09-23 MED ORDER — NITROGLYCERIN 1 MG/10 ML FOR IR/CATH LAB
INTRA_ARTERIAL | Status: AC
  Filled 2022-09-23: qty 10

## 2022-09-23 MED ORDER — HEPARIN (PORCINE) 25000 UT/250ML-% IV SOLN
500.0000 [IU]/h | INTRAVENOUS | Status: DC
  Administered 2022-09-23: 500 [IU]/h via INTRAVENOUS

## 2022-09-23 MED ORDER — FENTANYL CITRATE (PF) 100 MCG/2ML IJ SOLN: 25.0000 ug | INTRAMUSCULAR | Status: DC | PRN

## 2022-09-23 MED ORDER — CLEVIDIPINE BUTYRATE 0.5 MG/ML IV EMUL
INTRAVENOUS | Status: DC | PRN
  Administered 2022-09-23: 2 mg/h via INTRAVENOUS

## 2022-09-23 MED ORDER — LACTATED RINGERS IV SOLN: INTRAVENOUS | Status: DC

## 2022-09-23 MED ORDER — NICOTINE 21 MG/24HR TD PT24
21.0000 mg | MEDICATED_PATCH | Freq: Every day | TRANSDERMAL | Status: DC
  Administered 2022-09-23: 21 mg via TRANSDERMAL
  Filled 2022-09-23: qty 1

## 2022-09-23 MED ORDER — ACETAMINOPHEN 650 MG RE SUPP: 650.0000 mg | RECTAL | Status: DC | PRN

## 2022-09-23 MED ORDER — FLUOXETINE HCL 20 MG PO CAPS
40.0000 mg | ORAL_CAPSULE | Freq: Every day | ORAL | Status: DC
  Administered 2022-09-23: 40 mg via ORAL
  Filled 2022-09-23: qty 2

## 2022-09-23 MED ORDER — PHENYLEPHRINE HCL-NACL 20-0.9 MG/250ML-% IV SOLN
INTRAVENOUS | Status: DC | PRN
  Administered 2022-09-23: 160 ug via INTRAVENOUS
  Administered 2022-09-23: 30 ug/min via INTRAVENOUS

## 2022-09-23 MED ORDER — ORAL CARE MOUTH RINSE: 15.0000 mL | Freq: Once | OROMUCOSAL | Status: AC

## 2022-09-23 MED ORDER — ASPIRIN 81 MG PO CHEW
81.0000 mg | CHEWABLE_TABLET | Freq: Every day | ORAL | Status: DC
  Administered 2022-09-24: 81 mg via ORAL
  Filled 2022-09-23: qty 1

## 2022-09-23 MED ORDER — GLYCOPYRROLATE PF 0.2 MG/ML IJ SOSY
PREFILLED_SYRINGE | INTRAMUSCULAR | Status: DC | PRN
  Administered 2022-09-23: .2 mg via INTRAVENOUS

## 2022-09-23 NOTE — Sedation Documentation (Signed)
ACT 195.

## 2022-09-23 NOTE — Anesthesia Postprocedure Evaluation (Signed)
Anesthesia Post Note  Patient: Kristen Knox  Procedure(s) Performed: Angioplasty/Stenting     Patient location during evaluation: PACU Anesthesia Type: General Level of consciousness: awake and alert, oriented and patient cooperative Pain management: pain level controlled Vital Signs Assessment: post-procedure vital signs reviewed and stable Respiratory status: spontaneous breathing, nonlabored ventilation and respiratory function stable Cardiovascular status: blood pressure returned to baseline and stable Postop Assessment: no apparent nausea or vomiting Anesthetic complications: no   No notable events documented.  Last Vitals:  Vitals:   09/23/22 1245 09/23/22 1300  BP: 112/66 126/67  Pulse: 74 64  Resp: 15 12  Temp:    SpO2: 96% 96%    Last Pain:  Vitals:   09/23/22 1245  TempSrc:   PainSc: 0-No pain                 Lannie Fields

## 2022-09-23 NOTE — Anesthesia Procedure Notes (Signed)
Procedure Name: Intubation Date/Time: 09/23/2022 8:59 AM  Performed by: Loleta Maston Wight, CRNAPre-anesthesia Checklist: Patient identified, Patient being monitored, Timeout performed, Emergency Drugs available and Suction available Patient Re-evaluated:Patient Re-evaluated prior to induction Oxygen Delivery Method: Circle system utilized Preoxygenation: Pre-oxygenation with 100% oxygen Induction Type: IV induction Ventilation: Mask ventilation without difficulty Laryngoscope Size: Mac and 4 Grade View: Grade II Tube type: Oral Tube size: 7.0 mm Number of attempts: 1 Airway Equipment and Method: Stylet Placement Confirmation: ETT inserted through vocal cords under direct vision, positive ETCO2 and breath sounds checked- equal and bilateral Secured at: 22 cm Tube secured with: Tape Dental Injury: Teeth and Oropharynx as per pre-operative assessment

## 2022-09-23 NOTE — Progress Notes (Signed)
RN had to initiate phenylephrine infusion per order from Dr. Tommie Sams. Current IV site with pressor is C/D/I and has blood return. Per policy, IV team did come up to get a ultrasound guided PIV. Patient refused to allow them to place another IV. This RN explained to patient that currently her SBP is below where the MD wants it, and this medication is needed to correct that. RN explained that this medication can be damaging to surrounding tissue should it leak out the vein. RN explained that an ultrasound guided IV is more secure and less likely to leak out the vein, so it is important to get one. Patient still refusing. RN discussed with IV team RN.

## 2022-09-23 NOTE — Sedation Documentation (Signed)
Knee immobilizer placed on RLE per Dr. Corliss Skains.

## 2022-09-23 NOTE — Procedures (Signed)
INR.  Status post right common carotid arteriogram.  Right CFA approach.  Findings.  1.  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  Status post complete revascularization of high-grade proximal right ICA stenosis with stent assisted angioplasty, and distal protection.  Manual compression held at  the right groin puncture site with quick clot for approximately 25 minutes for hemostasis.  Distal pulses all present bilaterally unchanged from prior to the procedure.  Patient extubated.  Denies any headaches nausea vomiting or visual symptoms.  Pupils 2 to 3 mm round regular reactive sluggishly.  No facial asymmetry.  Tongue midline.  Moves all fours equally.  Fatima Sanger MD.

## 2022-09-23 NOTE — Progress Notes (Signed)
RN discussed with Dr. Tommie Sams that patient is a smoker and is craving nicotine. Patient stated she smokes about a pack and a half a day. Verbal orders for nicotine patch, 21 mg. Patient stated she takes xanax and prozac as home meds for anxiety. Patient inquired about taking those tonight. Verbal order from MD to start these home meds.

## 2022-09-23 NOTE — Sedation Documentation (Signed)
ACT 214

## 2022-09-23 NOTE — Anesthesia Procedure Notes (Signed)
Arterial Line Insertion Start/End8/19/2024 8:15 AM, 09/23/2022 8:30 AM Performed by: Loleta Valeda Corzine, CRNA, CRNA  Patient location: Pre-op. Preanesthetic checklist: patient identified, IV checked, site marked, risks and benefits discussed, surgical consent, monitors and equipment checked, pre-op evaluation, timeout performed and anesthesia consent Lidocaine 1% used for infiltration radial was placed Catheter size: 20 G Hand hygiene performed  Allen's test indicative of satisfactory collateral circulation Attempts: 1 Procedure performed without using ultrasound guided technique. Following insertion, Biopatch and dressing applied. Post procedure assessment: normal  Patient tolerated the procedure well with no immediate complications.

## 2022-09-23 NOTE — Progress Notes (Signed)
Interventional Radiology Brief Note:  Kristen Knox is a 62 year old female s/p stent assisted angioplasty of right ICA high-grade stenosis.   Patient assessed at bedside alongside Dr. Corliss Skains this afternoon.   Alert, aware and oriented Speech intact Comprehension is intact No facial droop Proprioception intact  Moving all extremities without weakness.    Groin site intact.  Soft, no erythema.  No evidence of hematoma or pseudoaneurysm.    Continue with observation overnight once bed on 4N available.   Plan for discharge tomorrow if recovers well as expected.   Loyce Dys, MS RD PA-C 4:26 PM

## 2022-09-23 NOTE — Procedures (Signed)
INR

## 2022-09-23 NOTE — Progress Notes (Addendum)
ANTICOAGULATION CONSULT NOTE - Initial Consult  Pharmacy Consult:  Heparin Indication: post IR Neuroradiology procedure   No Known Allergies  Patient Measurements: Height: 5\' 2"  (157.5 cm) Weight: 49 kg (108 lb) IBW/kg (Calculated) : 50.1 Heparin Dosing Weight: 49 kg  Vital Signs: Temp: 98.2 F (36.8 C) (08/19 1157) Temp Source: Oral (08/19 0634) BP: 132/74 (08/19 1545) Pulse Rate: 55 (08/19 1545)  Labs: Recent Labs    09/23/22 0626  HGB 10.3*  HCT 31.7*  PLT 329  LABPROT 12.2  INR 0.9  CREATININE 0.59    Estimated Creatinine Clearance: 55 mL/min (by C-G formula based on SCr of 0.59 mg/dL).   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   Dyspnea    with exertion and pt is a smoker   Headache    HLD (hyperlipidemia)    Hypertension    Hypothyroidism    Osteoarthritis    Stroke (HCC) 2018   Thyroid nodule 2020     Assessment: 88 YOF with high grade stenosis of R ICA now s/p stenting.  Pharmacy consulted to dose IV heparin.  Heparin already started in PACU.  CBC stable; no bleeding reported.  Goal of Therapy:  Heparin level 0.1-0.25 units/ml Monitor platelets by anticoagulation protocol: Yes   Plan:  Continue heparin gtt at 500 units/hr Check 6 hr heparin level   Hera Celaya D. Laney Potash, PharmD, BCPS, BCCCP 09/23/2022, 4:04 PM   =========================  Addendum: Heparin level therapeutic at 0.14 units/mL Continue heparin until tomorrow AM per protocol  Tais Koestner D. Laney Potash, PharmD, BCPS, BCCCP 09/23/2022, 9:49 PM

## 2022-09-23 NOTE — Progress Notes (Signed)
RN spoke with Dr. Tommie Sams. MD made aware that patient's blood pressure via arterial line is holding below ordered goal range of SBP 120-140. Patient is neuro appropriate. Per MD, new goal for tonight is SBP 100-140 as long as patient stays neuro intact. If patient's SBP falls below 100 or neuro status changes, RN to give normal saline bolus of 250 mL. If SBP continues to maintain below 100, RN to start phenylephrine infusion. Patient aware of RN conversation with MD.

## 2022-09-23 NOTE — Transfer of Care (Signed)
Immediate Anesthesia Transfer of Care Note  Patient: Kristen Knox  Procedure(s) Performed: Angioplasty/Stenting  Patient Location: PACU  Anesthesia Type:General  Level of Consciousness: awake  Airway & Oxygen Therapy: Patient Spontanous Breathing  Post-op Assessment: Report given to RN, Post -op Vital signs reviewed and stable, Patient moving all extremities, and Patient able to stick tongue midline  Post vital signs: Reviewed and stable  Last Vitals:  Vitals Value Taken Time  BP 122/60 09/23/22 1200  Temp    Pulse 75 09/23/22 1203  Resp 14 09/23/22 1203  SpO2 95 % 09/23/22 1203  Vitals shown include unfiled device data.  Last Pain:  Vitals:   09/23/22 0707  TempSrc:   PainSc: 0-No pain         Complications: No notable events documented.

## 2022-09-24 ENCOUNTER — Encounter (HOSPITAL_COMMUNITY): Payer: Self-pay | Admitting: Interventional Radiology

## 2022-09-24 ENCOUNTER — Other Ambulatory Visit: Payer: Self-pay

## 2022-09-24 ENCOUNTER — Other Ambulatory Visit: Payer: Self-pay | Admitting: Student

## 2022-09-24 LAB — CBC WITH DIFFERENTIAL/PLATELET
Abs Immature Granulocytes: 0.02 10*3/uL (ref 0.00–0.07)
Basophils Absolute: 0 10*3/uL (ref 0.0–0.1)
Basophils Relative: 0 %
Eosinophils Absolute: 0 10*3/uL (ref 0.0–0.5)
Eosinophils Relative: 0 %
HCT: 23.7 % — ABNORMAL LOW (ref 36.0–46.0)
Hemoglobin: 7.9 g/dL — ABNORMAL LOW (ref 12.0–15.0)
Immature Granulocytes: 0 %
Lymphocytes Relative: 16 %
Lymphs Abs: 1.1 10*3/uL (ref 0.7–4.0)
MCH: 32.2 pg (ref 26.0–34.0)
MCHC: 33.3 g/dL (ref 30.0–36.0)
MCV: 96.7 fL (ref 80.0–100.0)
Monocytes Absolute: 0.7 10*3/uL (ref 0.1–1.0)
Monocytes Relative: 10 %
Neutro Abs: 4.9 10*3/uL (ref 1.7–7.7)
Neutrophils Relative %: 74 %
Platelets: 241 10*3/uL (ref 150–400)
RBC: 2.45 MIL/uL — ABNORMAL LOW (ref 3.87–5.11)
RDW: 14 % (ref 11.5–15.5)
WBC: 6.6 10*3/uL (ref 4.0–10.5)
nRBC: 0 % (ref 0.0–0.2)

## 2022-09-24 LAB — BASIC METABOLIC PANEL
Anion gap: 13 (ref 5–15)
BUN: 7 mg/dL — ABNORMAL LOW (ref 8–23)
CO2: 21 mmol/L — ABNORMAL LOW (ref 22–32)
Calcium: 8.2 mg/dL — ABNORMAL LOW (ref 8.9–10.3)
Chloride: 102 mmol/L (ref 98–111)
Creatinine, Ser: 0.5 mg/dL (ref 0.44–1.00)
GFR, Estimated: >60 mL/min (ref >60)
Glucose, Bld: 119 mg/dL — ABNORMAL HIGH (ref 70–99)
Potassium: 3.6 mmol/L (ref 3.5–5.1)
Sodium: 136 mmol/L (ref 135–145)

## 2022-09-24 MED ORDER — SODIUM CHLORIDE 0.9 % IV BOLUS
250.0000 mL | Freq: Once | INTRAVENOUS | Status: AC
  Administered 2022-09-24: 250 mL via INTRAVENOUS

## 2022-09-24 MED ORDER — ALPRAZOLAM 0.5 MG PO TABS: 2.0000 mg | ORAL_TABLET | Freq: Once | ORAL | Status: DC

## 2022-09-24 MED ORDER — ALPRAZOLAM 0.5 MG PO TABS: 1.0000 mg | ORAL_TABLET | Freq: Four times a day (QID) | ORAL | Status: DC | PRN

## 2022-09-24 MED ORDER — TICAGRELOR 90 MG PO TABS: 90.0000 mg | ORAL_TABLET | Freq: Two times a day (BID) | ORAL | 0 refills | Status: DC

## 2022-09-24 MED ORDER — ALPRAZOLAM 0.5 MG PO TABS
1.0000 mg | ORAL_TABLET | Freq: Once | ORAL | Status: AC
  Administered 2022-09-24: 1 mg via ORAL
  Filled 2022-09-24: qty 2

## 2022-09-24 MED ORDER — TICAGRELOR 60 MG PO TABS: 60.0000 mg | ORAL_TABLET | Freq: Two times a day (BID) | ORAL | 0 refills | Status: DC

## 2022-09-24 MED ORDER — LIDOCAINE-PRILOCAINE 2.5-2.5 % EX CREA
TOPICAL_CREAM | CUTANEOUS | Status: DC | PRN
  Filled 2022-09-24: qty 5

## 2022-09-24 MED ORDER — SODIUM CHLORIDE 0.9 % IV SOLN: INTRAVENOUS | Status: AC

## 2022-09-24 NOTE — Discharge Summary (Cosign Needed Addendum)
Patient ID: Kristen Knox MRN: [id number] DOB/AGE: 64-23-1962 64 y.o.  Admit date: 09/23/2022 Discharge date: 09/24/2022  Supervising Physician: Julieanne Cotton  Patient Status: [facility] - In-pt  Admission Diagnoses: R ICA stenosis  Discharge Diagnoses:  Principal Problem:   Internal carotid artery stenosis, right   Discharged Condition: good  Hospital Course:   Patient is known to Dr. 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 Her hgb dropped from 11.3 pre procedure to 8.5 post, she reported dark stool from 7/4-7/5, and fecal occult was positive. Therefore she was placed on Brilinta 45 mg BID instead of 90 mg, recommended follow up with GI/PCP closely. She has not seen by GI but has close follow up with her PCP.   Patient presented to [facility] for an cerebral angiogram with intervention for R ICA stenosis with Dr. Corliss Skains on 09/23/22, underwent stent placement. Procedure occurred without major complications and patient was transferred to floor in stable condition (extubated w/o difficulty, VSS, R CFA puncture site stable) for overnight observation.   Patient had hypotension overnight, required phenylephrine which was discontinued this morning ay 0554 hrs, BP has been stable since then.  CBC this morning showed dropped in hgb from pre procedure 10.3 to post procedure 7.9. Patient awake and alert  no complaints at this time, eating breakfast. Denies having blood in BM or other active bleeding. R CFA puncture site stable.   Patient to attempt ambulation today, if stable plan to discharge home and follow-up with Dr. Corliss Skains at [facility] in 2 weeks after discharge.  Patient was instructed to: - No bending, stooping, lifting more than 10 pounds for 2 weeks - No driving for 2 weeks  - Keep the right groin puncture site clean and dry until fully heals, no submerging (sitting in a tub, swimming) for 7 days - Strongly recommended smoking  cessation  - Continue taking ASA 81 mg daily  - Start taking Brilinta 90 mg twice a day until follow up visit with Dr. Corliss Skains in 2 weeks. If she develops dark stool while she is on Brilinta 90 mg, she should contact Dr. Marlis Edelson immediately. If she develop signs of acute blood loss like light headedness, fatigue, shortness of breath, chest palpitation or pain, she should go to ED immediately. She should void taking Ibuprofen while she is on Brilinta  90 mg BID.   ADDEMDUM - Notified by RN that patient's BP dropped to 85/65, she is asymptomatic, placed back on pressor. Discussed with Dr. Corliss Skains, d/s pressor and give NS bolus 250 mL followed by 75 mL/hr x 1 hr ordered. If BP remains unstable after infusion, patient may stay one more night. Patient will be on Brilinta 60 mg BID for at least 2 weeks. Patient was informed the dose change, new prescription sent, and pharmacy asked to cancel the 90 mg BID prescription.   - Patient' BP improved with NS bolus and infusion, but trending down to 100/67 at 1430 hrs.  Patient seen with Dr. Corliss Skains at 1545 hrs.  She is laying in bed, sleeping, states that she is feeling fine, denies sx of hypotension.  States that her son will be at home with her, she also has granddaughter living with her.  Orthostatic BP sitting 130/74, standing 121/64, HR remain in 70s.  As patient is asymptomatic and she has good support at home, OK to discharge today. Patient was instructed to NOT take her Lisinopril tonight, she can start taking tomorrow.  She may take Brilinta 45 tonight, but should start taking 60 mg as soon as possible.  She was instructed to keep herself well hydrated. She verbalized understanding.   Consults: None  Significant Diagnostic Studies: none   Treatments: R ICA stent assisted angioplasty   Discharge Exam: Blood pressure 109/67, pulse 67, temperature 98 F (36.7 C), temperature source Oral, resp. rate 16, height 5\' 2"  (1.575 m), weight 108 lb  (49 kg), SpO2 96%.  Physical Exam Vitals reviewed.  Constitutional:      General: She is not in acute distress.    Appearance: She is not ill-appearing.  HENT:     Head: Normocephalic and atraumatic.  Eyes:     Extraocular Movements: Extraocular movements intact.  Cardiovascular:     Rate and Rhythm: Normal rate and regular rhythm.     Heart sounds: Normal heart sounds.  Pulmonary:     Effort: Pulmonary effort is normal.     Breath sounds: Normal breath sounds.  Abdominal:     General: Abdomen is flat. Bowel sounds are normal.     Palpations: Abdomen is soft.  Musculoskeletal:     Cervical back: Neck supple.  Skin:    General: Skin is warm and dry.     Coloration: Skin is not jaundiced or pale.     Comments: Pressure dressing on R CFA site, site is soft and non tender. R DP 2+  Neurological:     Mental Status: She is alert and oriented to person, place, and time.     Comments: Alert, awake, and oriented x3 Speech and comprehension intact PERRL 3-4 mm  EOMs intact, no nystagmus or subjective diplopia. No facial asymmetry. Tongue midline  No pronator drift. Fine motor and coordination intact    Psychiatric:        Mood and Affect: Mood normal.        Behavior: Behavior normal.        Judgment: Judgment normal.     Disposition:    Allergies as of 09/24/2022   No Known Allergies      Medication List     TAKE these medications    alprazolam 2 MG tablet Commonly known as: XANAX Take 2 mg by mouth 2 (two) times daily. Patient takes Xanax 1 mg tab four times daily   aspirin 81 MG chewable tablet Chew 81 mg by mouth 2 (two) times daily.   FLUoxetine 40 MG capsule Commonly known as: PROZAC Take 40 mg by mouth at bedtime.   ibuprofen 200 MG tablet Commonly known as: ADVIL Take 400 mg by mouth every 6 (six) hours as needed for headache, mild pain or moderate pain (Back pain).   lisinopril 40 MG tablet Commonly known as: ZESTRIL Take 40 mg by mouth at  bedtime.   rosuvastatin 20 MG tablet Commonly known as: CRESTOR Take 20 mg by mouth at bedtime.   ticagrelor 60 MG Tabs tablet Commonly known as: Brilinta Take 1 tablet (60 mg total) by mouth 2 (two) times daily. What changed:  medication strength how much to take        Follow-up Information     Julieanne Cotton, MD Follow up.   Specialties: Interventional Radiology, Radiology Why: 2 week follow up visit with Dr. Corliss Skains. Our schedulers will call you to set up the appointment. Contact information: [address] [place] [address] [phone]                  Electronically Signed: Lynann Bologna  Luvenia Cranford, PA-C 09/24/2022, 4:03 PM   I have spent Less Than 30 Minutes discharging Kristen Knox.

## 2022-09-24 NOTE — Progress Notes (Signed)
 An USGPIV (ultrasound guided PIV) has been placed for short-term vasopressor infusion. A correctly placed ivWatch must be used when administering Vasopressors. Should this treatment be needed beyond 24 hours, central line access should be obtained.  It will be the responsibility of the bedside nurse to follow best practice to prevent extravasations.

## 2022-09-24 NOTE — Discharge Instructions (Addendum)
Post Procedure Instruction   - No bending, stooping, lifting more than 10 pounds for 2 weeks - No driving for 2 weeks  - Keep the right groin puncture site clean and dry until fully heals, no submerging (sitting in a tub, swimming) for 7 days - Strongly recommend smoking cessation  - Continue taking ASA 81 mg daily  - Start taking Brilinta 60 mg twice a day until follow up visit with Dr. Corliss Skains in 2 weeks. If she develops dark stool while she is on Brilinta 60 mg, she should contact Dr. Marlis Edelson immediately. If she develop signs of acute blood loss such as light headedness, fatigue, shortness of breath, chest palpitation or chest pain, she should go to ED immediately. She should void taking Ibuprofen while she is on Brilinta  60 mg as Ibuprofen increase risk of gastrointestinal bleeding.

## 2022-09-26 ENCOUNTER — Encounter (HOSPITAL_COMMUNITY): Payer: Self-pay

## 2022-09-26 ENCOUNTER — Other Ambulatory Visit (HOSPITAL_COMMUNITY): Payer: Self-pay | Admitting: Interventional Radiology

## 2022-09-26 DIAGNOSIS — I6529 Occlusion and stenosis of unspecified carotid artery: Secondary | ICD-10-CM

## 2022-09-26 HISTORY — PX: IR ANGIO INTRA EXTRACRAN SEL COM CAROTID INNOMINATE UNI R MOD SED: IMG5359

## 2022-10-15 ENCOUNTER — Telehealth (HOSPITAL_COMMUNITY): Payer: Self-pay

## 2022-10-15 NOTE — Telephone Encounter (Signed)
Pt called to cancel consult for tomorrow, she will call back to reschedule. AB

## 2022-10-16 ENCOUNTER — Ambulatory Visit (HOSPITAL_COMMUNITY): Payer: 59

## 2022-11-28 ENCOUNTER — Telehealth (HOSPITAL_COMMUNITY): Payer: Self-pay

## 2022-11-28 ENCOUNTER — Other Ambulatory Visit: Payer: Self-pay | Admitting: Student

## 2022-11-28 DIAGNOSIS — I671 Cerebral aneurysm, nonruptured: Secondary | ICD-10-CM

## 2022-11-28 MED ORDER — TICAGRELOR 60 MG PO TABS
60.0000 mg | ORAL_TABLET | Freq: Two times a day (BID) | ORAL | 1 refills | Status: DC
Start: 2022-11-28 — End: 2023-01-21

## 2022-11-28 NOTE — Telephone Encounter (Signed)
Pt called needing a refill of her Brilinta. Sent a message to our PA's to call this in. AB

## 2022-11-28 NOTE — Telephone Encounter (Signed)
Refill of Brilinta 60mg  BID called to [facility] on [address]  Loyce Dys, MS RD PA-C

## 2023-01-21 ENCOUNTER — Other Ambulatory Visit: Payer: Self-pay | Admitting: Student

## 2023-01-21 DIAGNOSIS — I671 Cerebral aneurysm, nonruptured: Secondary | ICD-10-CM

## 2023-01-21 MED ORDER — TICAGRELOR 60 MG PO TABS
60.0000 mg | ORAL_TABLET | Freq: Two times a day (BID) | ORAL | 2 refills | Status: AC
Start: 2023-01-21 — End: 2023-04-21

## 2023-01-21 NOTE — Telephone Encounter (Signed)
Refill of Brilinta 60mg  BID called into [facility].   Loyce Dys, MS RD PA-C

## 2023-07-28 ENCOUNTER — Other Ambulatory Visit: Payer: Self-pay | Admitting: Student

## 2023-07-28 DIAGNOSIS — I6521 Occlusion and stenosis of right carotid artery: Secondary | ICD-10-CM

## 2023-07-28 MED ORDER — TICAGRELOR 60 MG PO TABS: 60.0000 mg | ORAL_TABLET | Freq: Two times a day (BID) | ORAL | 2 refills | Status: AC

## 2023-07-28 NOTE — Telephone Encounter (Signed)
 Refill request for Brilinta  60mg  BID reviewed.  Patient was noted to be taking DAPT 08/2022 in anticipation of intracranial revascularization procedure with Dr. Dolphus 09/23/22.  Medication was not on her active med list, but as of last follow-up with our office he was to be taking DAPT after prior pipeline stent placement 08/12/22.  Medication refilled at his last known tolerated dose of 60mg  BID ([id number] 08/2022).  Forwarded patient information to schedulers as patient is due for repeat imaging.   Zong Mcquarrie, MS RD PA-C

## 2023-07-29 ENCOUNTER — Telehealth (HOSPITAL_COMMUNITY): Payer: Self-pay

## 2023-07-29 NOTE — Telephone Encounter (Signed)
Called to schedule cta head/neck, no answer, left vm. AB  

## 2023-08-01 ENCOUNTER — Encounter (HOSPITAL_COMMUNITY): Payer: Self-pay | Admitting: Interventional Radiology

## 2023-11-24 ENCOUNTER — Telehealth (HOSPITAL_COMMUNITY): Payer: Self-pay | Admitting: Radiology

## 2023-11-24 NOTE — Telephone Encounter (Signed)
 Pt called to see when her fu with Deveshwar was due. I informed her that she was due back in June 2025. I also let her know that Dr. Dolphus no longer worked here. I offered her the names of our new NIR doctors with [facility] and a couple of outside doctors. She wishes to speak with her PCP and call me back upon her decision. JM
